# Patient Record
Sex: Female | Born: 1990 | Race: Black or African American | Hispanic: No | Marital: Single | Smoking: Never smoker
Health system: Southern US, Community
[De-identification: ages and names within clinical notes are randomized; demographics above are authoritative.]

## PROBLEM LIST (undated history)

## (undated) ENCOUNTER — Inpatient Hospital Stay (HOSPITAL_COMMUNITY): Payer: Self-pay

## (undated) DIAGNOSIS — Z789 Other specified health status: Secondary | ICD-10-CM

## (undated) DIAGNOSIS — D62 Acute posthemorrhagic anemia: Secondary | ICD-10-CM

## (undated) DIAGNOSIS — Z8759 Personal history of other complications of pregnancy, childbirth and the puerperium: Secondary | ICD-10-CM

## (undated) HISTORY — PX: NO PAST SURGERIES: SHX2092

---

## 2015-07-11 ENCOUNTER — Encounter (HOSPITAL_BASED_OUTPATIENT_CLINIC_OR_DEPARTMENT_OTHER): Payer: Self-pay | Admitting: Emergency Medicine

## 2015-07-11 DIAGNOSIS — L03011 Cellulitis of right finger: Secondary | ICD-10-CM | POA: Insufficient documentation

## 2015-07-11 NOTE — ED Notes (Signed)
Patient states that she has had pain and swelling to her right index finger since wed.

## 2015-07-12 ENCOUNTER — Emergency Department (HOSPITAL_BASED_OUTPATIENT_CLINIC_OR_DEPARTMENT_OTHER)
Admission: EM | Admit: 2015-07-12 | Discharge: 2015-07-12 | Disposition: A | Discharge status: Home / Self Care | Payer: Self-pay | Attending: Emergency Medicine | Admitting: Emergency Medicine

## 2015-07-12 DIAGNOSIS — L03011 Cellulitis of right finger: Secondary | ICD-10-CM

## 2015-07-12 MED ORDER — LIDOCAINE HCL (PF) 1 % IJ SOLN
5.0000 mL | Freq: Once | INTRAMUSCULAR | Status: AC
  Administered 2015-07-12: 5 mL via INTRADERMAL

## 2015-07-12 MED ORDER — LIDOCAINE HCL (PF) 1 % IJ SOLN
INTRAMUSCULAR | Status: AC
  Administered 2015-07-12: 5 mL via INTRADERMAL
  Filled 2015-07-12: qty 5

## 2015-07-12 NOTE — ED Notes (Signed)
Swelling, redness and pain around nail on first finger rt hand x 3 days

## 2015-07-12 NOTE — Discharge Instructions (Signed)
Paronychia °Paronychia is an infection of the skin that surrounds a nail. It usually affects the skin around a fingernail, but it may also occur near a toenail. It often causes pain and swelling around the nail. This condition may come on suddenly or develop over a longer period. In some cases, a collection of pus (abscess) can form near or under the nail. Usually, paronychia is not serious and it clears up with treatment. °CAUSES °This condition may be caused by bacteria or fungi. It is commonly caused by either Streptococcus or Staphylococcus bacteria. The bacteria or fungi often cause the infection by getting into the affected area through an opening in the skin, such as a cut or a hangnail. °RISK FACTORS °This condition is more likely to develop in: °· People who get their hands wet often, such as those who work as dishwashers, bartenders, or nurses. °· People who bite their fingernails or suck their thumbs. °· People who trim their nails too short. °· People who have hangnails or injured fingertips. °· People who get manicures. °· People who have diabetes. °SYMPTOMS °Symptoms of this condition include: °· Redness and swelling of the skin near the nail. °· Tenderness around the nail when you touch the area. °· Pus-filled bumps under the cuticle. The cuticle is the skin at the base or sides of the nail. °· Fluid or pus under the nail. °· Throbbing pain in the area. °DIAGNOSIS °This condition is usually diagnosed with a physical exam. In some cases, a sample of pus may be taken from an abscess to be tested in a lab. This can help to determine what type of bacteria or fungi is causing the condition. °TREATMENT °Treatment for this condition depends on the cause and severity of the condition. If the condition is mild, it may clear up on its own in a few days. Your health care provider may recommend soaking the affected area in warm water a few times a day. When treatment is needed, the options may  include: °· Antibiotic medicine, if the condition is caused by a bacterial infection. °· Antifungal medicine, if the condition is caused by a fungal infection. °· Incision and drainage, if an abscess is present. In this procedure, the health care provider will cut open the abscess so the pus can drain out. °HOME CARE INSTRUCTIONS °· Soak the affected area in warm water if directed to do so by your health care provider. You may be told to do this for 20 minutes, 2-3 times a day. Keep the area dry in between soakings. °· Take medicines only as directed by your health care provider. °· If you were prescribed an antibiotic medicine, finish all of it even if you start to feel better. °· Keep the affected area clean. °· Do not try to drain a fluid-filled bump yourself. °· If you will be washing dishes or performing other tasks that require your hands to get wet, wear rubber gloves. You should also wear gloves if your hands might come in contact with irritating substances, such as cleaners or chemicals. °· Follow your health care provider's instructions about: °¨ Wound care. °¨ Bandage (dressing) changes and removal. °SEEK MEDICAL CARE IF: °· Your symptoms get worse or do not improve with treatment. °· You have a fever or chills. °· You have redness spreading from the affected area. °· You have continued or increased fluid, blood, or pus coming from the affected area. °· Your finger or knuckle becomes swollen or is difficult to move. °  °  This information is not intended to replace advice given to you by your health care provider. Make sure you discuss any questions you have with your health care provider. °  °Document Released: 09/01/2000 Document Revised: 07/23/2014 Document Reviewed: 02/13/2014 °Elsevier Interactive Patient Education ©2016 [employer] ° °

## 2015-07-12 NOTE — ED Provider Notes (Signed)
CSN: [id number]     Arrival date & time 07/11/15  2308 History   First MD Initiated Contact with Patient 07/12/15 0155     Chief Complaint  Patient presents with  . Nail Problem     (Consider location/radiation/quality/duration/timing/severity/associated sxs/prior Treatment) HPI Comments: 25 year old female who presents with right index finger pain. 3 days ago, the patient began having swelling, redness, and pain around her nail on her right second finger. She denies any trauma or biting cuticles. No fevers or other complaints. Normal sensation R hand.  The history is provided by the patient.    History reviewed. No pertinent past medical history. History reviewed. No pertinent past surgical history. History reviewed. No pertinent family history. Social History  Substance Use Topics  . Smoking status: Never Smoker   . Smokeless tobacco: None  . Alcohol Use: Yes     Comment: occ   OB History    No data available     Review of Systems  Constitutional: Negative for fever and chills.  Skin: Positive for color change and wound.  Neurological: Negative for numbness.     Allergies  Review of patient's allergies indicates no known allergies.  Home Medications   Prior to Admission medications   Not on File   BP 110/62 mmHg  Pulse 74  Temp(Src) 97.8 F (36.6 C) (Oral)  Resp 18  Ht 5\' 1"  (1.549 m)  Wt 150 lb (68.04 kg)  BMI 28.36 kg/m2  SpO2 100%  LMP 07/09/2015 Physical Exam  Constitutional: She is oriented to person, place, and time. She appears well-developed and well-nourished. No distress.  HENT:  Head: Normocephalic and atraumatic.  Nose: Nose normal.  Eyes: Conjunctivae are normal.  Musculoskeletal: Normal range of motion. She exhibits tenderness.  TTP distal R 2nd finger around nail bed w/ local erythema and swelling  Neurological: She is alert and oriented to person, place, and time.  Skin: Skin is warm and dry. There is erythema.  Erythema and swelling  at ulnar side of R 2nd fingernail  Psychiatric: She has a normal mood and affect. Judgment normal.  Nursing note and vitals reviewed.   ED Course  .Marland Kitchen.Incision and Drainage Date/Time: 07/12/2015 9:23 AM Performed by: Laurence SpatesLITTLE, Vishaal Strollo MORGAN Authorized by: Laurence SpatesLITTLE, Genavie Boettger MORGAN Consent: Verbal consent obtained. Consent given by: patient Patient identity confirmed: verbally with patient Type: abscess (paronychia) Body area: upper extremity Location details: right index finger Anesthesia: local infiltration Local anesthetic: lidocaine 1% without epinephrine Anesthetic total: 0.2 ml Incision type: single straight Incision depth: dermal Complexity: simple Drainage: purulent Drainage amount: scant Wound treatment: wound left open Packing material: none Patient tolerance: Patient tolerated the procedure well with no immediate complications   (including critical care time) Labs Review Labs Reviewed - No data to display   MDM   Final diagnoses:  Paronychia of finger, right    Pt w/ Paronychia of right index finger. Incised and drained at bedside under local anesthesia; see procedure note for details. Pt tolerated well. Applied bacitracin and discussed supportive care as well as return precautions including any signs of worsening infection. Patient voiced understanding was discharged in satisfactory condition.  Laurence Spatesachel Morgan Areli Jowett, MD 07/12/15 [phone]

## 2015-09-15 LAB — OB RESULTS CONSOLE HEPATITIS B SURFACE ANTIGEN: HEP B S AG: NEGATIVE

## 2015-09-15 LAB — OB RESULTS CONSOLE HIV ANTIBODY (ROUTINE TESTING): HIV: NONREACTIVE

## 2015-09-15 LAB — OB RESULTS CONSOLE RUBELLA ANTIBODY, IGM: RUBELLA: IMMUNE

## 2015-09-15 LAB — OB RESULTS CONSOLE RPR: RPR: NONREACTIVE

## 2015-09-24 LAB — OB RESULTS CONSOLE GC/CHLAMYDIA
Chlamydia: NEGATIVE
GC PROBE AMP, GENITAL: NEGATIVE

## 2015-10-04 ENCOUNTER — Encounter (HOSPITAL_COMMUNITY): Payer: Self-pay | Admitting: *Deleted

## 2015-10-04 ENCOUNTER — Inpatient Hospital Stay (HOSPITAL_COMMUNITY): Payer: PRIVATE HEALTH INSURANCE

## 2015-10-04 ENCOUNTER — Inpatient Hospital Stay (HOSPITAL_COMMUNITY)
Admission: AD | Admit: 2015-10-04 | Discharge: 2015-10-04 | Disposition: A | Discharge status: Home / Self Care | Payer: PRIVATE HEALTH INSURANCE | Source: Ambulatory Visit | Attending: Obstetrics and Gynecology | Admitting: Obstetrics and Gynecology

## 2015-10-04 DIAGNOSIS — O4690 Antepartum hemorrhage, unspecified, unspecified trimester: Secondary | ICD-10-CM

## 2015-10-04 DIAGNOSIS — O209 Hemorrhage in early pregnancy, unspecified: Secondary | ICD-10-CM | POA: Insufficient documentation

## 2015-10-04 DIAGNOSIS — Z6791 Unspecified blood type, Rh negative: Secondary | ICD-10-CM | POA: Insufficient documentation

## 2015-10-04 DIAGNOSIS — Z3A13 13 weeks gestation of pregnancy: Secondary | ICD-10-CM | POA: Insufficient documentation

## 2015-10-04 DIAGNOSIS — N930 Postcoital and contact bleeding: Secondary | ICD-10-CM | POA: Diagnosis not present

## 2015-10-04 DIAGNOSIS — N939 Abnormal uterine and vaginal bleeding, unspecified: Secondary | ICD-10-CM

## 2015-10-04 HISTORY — DX: Other specified health status: Z78.9

## 2015-10-04 LAB — URINE MICROSCOPIC-ADD ON: WBC UA: NONE SEEN WBC/hpf (ref 0–5)

## 2015-10-04 LAB — URINALYSIS, ROUTINE W REFLEX MICROSCOPIC
BILIRUBIN URINE: NEGATIVE
Glucose, UA: NEGATIVE mg/dL
KETONES UR: NEGATIVE mg/dL
LEUKOCYTES UA: NEGATIVE
NITRITE: NEGATIVE
Protein, ur: NEGATIVE mg/dL
Specific Gravity, Urine: <1.005 — ABNORMAL LOW (ref 1.005–1.030)
pH: 6.5 (ref 5.0–8.0)

## 2015-10-04 LAB — ABO/RH: ABO/RH(D): AB NEG

## 2015-10-04 MED ORDER — RHO D IMMUNE GLOBULIN 1500 UNIT/2ML IJ SOSY
300.0000 ug | PREFILLED_SYRINGE | Freq: Once | INTRAMUSCULAR | Status: AC
  Administered 2015-10-04: 300 ug via INTRAMUSCULAR
  Filled 2015-10-04: qty 2

## 2015-10-04 NOTE — MAU Provider Note (Signed)
History     Chief Complaint  Patient presents with  . Vaginal Bleeding   25 yo G1P0 SBF @  13 1/[redacted] weeks gestation presents with c/o postcoital vaginal bleeding since midnight. Pt denies cramping. Bleeding is now spotting but still bright red. [facility]  OB History    Gravida Para Term Preterm AB TAB SAB Ectopic Multiple Living   1               Past Medical History  Diagnosis Date  . Medical history non-contributory     Past Surgical History  Procedure Laterality Date  . No past surgeries      No family history on file.  Social History  Substance Use Topics  . Smoking status: Never Smoker   . Smokeless tobacco: None  . Alcohol Use: Yes     Comment: occ    Allergies: No Known Allergies  Prescriptions prior to admission  Medication Sig Dispense Refill Last Dose  . Prenatal Vit-Fe Fumarate-FA (PRENATAL MULTIVITAMIN) TABS tablet Take 1 tablet by mouth daily.   10/03/2015 at Unknown time     Physical Exam   Blood pressure 133/79, pulse 97, temperature 97.9 F (36.6 C), temperature source Oral, resp. rate 16, last menstrual period 07/09/2015.  General appearance: alert, cooperative and no distress Lungs: clear to auscultation bilaterally Heart: regular rate and rhythm, S1, S2 normal, no murmur, click, rub or gallop Abdomen: soft, non-tender; bowel sounds normal; no masses,  no organomegaly Pelvic: adnexae not palpable, cervix normal in appearance, external genitalia normal and vagina (+) scant BRB. cervix closed/firm, uterus gravid nontender Extremities: no edema, redness or tenderness in the calves or thighs Skin: Skin color, texture, turgor normal. No rashes or lesions   ED Course   vaginal bleeding in pregnancy<22 week Rh negative IUP @ 13 1/7 weeks P) sonogram . Rhig w/u. Rhophylac today.  If sonogram nl, d/c home Abstain from intercourse until stop bleeding MDM   Kenneisha Cochrane A, MD 4:30 PM  10/04/2015    Addendum:  [place] Ob Comp Less 14 Wks  10/04/2015  CLINICAL DATA:  Vaginal bleeding since midnight. Gravida 1 para 0. Uncertain LMP. EXAM: OBSTETRIC <14 WK ULTRASOUND TECHNIQUE: Transabdominal ultrasound was performed for evaluation of the gestation as well as the maternal uterus and adnexal regions. COMPARISON:  None. FINDINGS: Intrauterine gestational sac: Present Yolk sac:  Not seen Embryo:  Present Cardiac Activity: Present Heart Rate: 159 bpm CRL:   67  mm   13 w 0 d                  US EDC: 04/10/2016 Subchorionic hemorrhage:  None visualized. Maternal uterus/adnexae: Right ovary is not visualized. Left ovary has a normal appearance. No free pelvic fluid. IMPRESSION: 1. Single living intrauterine embryo. 2. Size by ultrasound correlates with EDC of 04/10/2016. Electronically Signed   By: Norva PavlovElizabeth  Brown M.D.   On: 10/04/2015 17:08

## 2015-10-04 NOTE — MAU Note (Signed)
Vaginal bleeding since last night around 0000.  As much as a flow of a period and it has tapered off since then, almost just spotting.  Denies pain.

## 2015-10-04 NOTE — MAU Note (Signed)
Urine sent to lab 

## 2015-10-04 NOTE — Progress Notes (Signed)
Results reviewed with Dr cousins.  D/c patient to home. Keep scheduled appointment, no intercourse, call office with any other concerns

## 2015-10-05 LAB — RH IG WORKUP (INCLUDES ABO/RH)
ABO/RH(D): AB NEG
Antibody Screen: NEGATIVE
GESTATIONAL AGE(WKS): 13
UNIT DIVISION: 0

## 2016-01-17 ENCOUNTER — Emergency Department (HOSPITAL_BASED_OUTPATIENT_CLINIC_OR_DEPARTMENT_OTHER)
Admission: EM | Admit: 2016-01-17 | Discharge: 2016-01-17 | Disposition: A | Discharge status: Home / Self Care | Payer: BLUE CROSS/BLUE SHIELD | Attending: Emergency Medicine | Admitting: Emergency Medicine

## 2016-01-17 ENCOUNTER — Encounter (HOSPITAL_BASED_OUTPATIENT_CLINIC_OR_DEPARTMENT_OTHER): Payer: Self-pay | Admitting: *Deleted

## 2016-01-17 DIAGNOSIS — R1084 Generalized abdominal pain: Secondary | ICD-10-CM | POA: Insufficient documentation

## 2016-01-17 DIAGNOSIS — O26893 Other specified pregnancy related conditions, third trimester: Secondary | ICD-10-CM | POA: Insufficient documentation

## 2016-01-17 DIAGNOSIS — Z3A28 28 weeks gestation of pregnancy: Secondary | ICD-10-CM | POA: Diagnosis not present

## 2016-01-17 DIAGNOSIS — R109 Unspecified abdominal pain: Secondary | ICD-10-CM

## 2016-01-17 DIAGNOSIS — Z79899 Other long term (current) drug therapy: Secondary | ICD-10-CM | POA: Diagnosis not present

## 2016-01-17 LAB — URINALYSIS, ROUTINE W REFLEX MICROSCOPIC
BILIRUBIN URINE: NEGATIVE
Glucose, UA: NEGATIVE mg/dL
HGB URINE DIPSTICK: NEGATIVE
Ketones, ur: NEGATIVE mg/dL
Nitrite: NEGATIVE
PH: 7 (ref 5.0–8.0)
Protein, ur: NEGATIVE mg/dL
SPECIFIC GRAVITY, URINE: 1.012 (ref 1.005–1.030)

## 2016-01-17 LAB — URINE MICROSCOPIC-ADD ON: RBC / HPF: NONE SEEN RBC/hpf (ref 0–5)

## 2016-01-17 MED ORDER — SODIUM CHLORIDE 0.9 % IV BOLUS (SEPSIS)
1000.0000 mL | Freq: Once | INTRAVENOUS | Status: AC
Start: 2016-01-17 — End: 2016-01-17
  Administered 2016-01-17: 1000 mL via INTRAVENOUS

## 2016-01-17 NOTE — Progress Notes (Signed)
Spoke with Dr. Cherly Hensenousins. Pt is OB cleared. Okay to d/c fetal monitor. Ed staff will either send urine for culture or will repeat U/A.

## 2016-01-17 NOTE — ED Triage Notes (Addendum)
Pt reports cramping abd pain that began last night; states this is her first pregnancy; EDD 04/09/2016; denies fever, n/v/d. Pt placed on toco monitor at this time. Reports pregnancy has been uncomplicated thus far. Reports good fetal movement.

## 2016-01-17 NOTE — ED Notes (Signed)
Pt taken off monitor to use the bathroom.

## 2016-01-17 NOTE — ED Notes (Signed)
MD at bedside. 

## 2016-01-17 NOTE — ED Triage Notes (Signed)
Mary, rapid response OB nurse called.

## 2016-01-17 NOTE — Progress Notes (Signed)
Spoke with Dr. Cherly Hensenousins. Pt is a G1P0 at [redacted] weeks gestation with c/o abd cramping. No vaginal bleeding or leaking of fluid. No uc's noted at this time. FHR 145, mod variability, no accels, no decels. Says she wants pt to have a U/A and they are to treat her if necessary . She can be dc'd home after that.

## 2016-01-17 NOTE — Progress Notes (Addendum)
Spoke with Claria Dicehristy Golden RN. Pt is to have a U/A per Dr. Cherly Hensenousins, they can treat her if necessary. Pt can be dc'd home. Dr. Cherly Hensenousins phone number given to [employer], [phone].

## 2016-01-17 NOTE — ED Notes (Signed)
Bedside ultrasound being performed by MD at this time

## 2016-01-17 NOTE — Progress Notes (Signed)
[facility], Claria Dicehristy Golden RN. Pt is a G1P0 at [redacted] weeks gestation with c/o abd cramping that started last night. No vaginal bleeding or leaking of fluid. Says Dr. Cherly Hensenousins is her OB/GYN.

## 2016-01-17 NOTE — ED Notes (Signed)
Spoke with Corrie DandyMary, rapid response OB nurse, about pt's plan of care and discharge instructions.

## 2016-01-17 NOTE — Progress Notes (Signed)
Spoke with [employer] Data Systems[employer]y RN. Okay to d/c fetal monitor. Recheck pt's urine or send it for culture. TX pt as necessary and then d/c her home.

## 2016-01-17 NOTE — Progress Notes (Signed)
Spoke with ChristyRN. Pt has been off the fetal monitor for about . Says she will adjust her monitor.

## 2016-01-17 NOTE — ED Provider Notes (Signed)
[facility]   CSN: [id number] Arrival date & time: 01/17/16  [id number]     History   Chief Complaint Chief Complaint  Patient presents with  . Abdominal Pain    HPI Alison Schultz is a 25 y.o. female.  [name] gravida 1 para 0 with a chief complaints of abdominal cramping. Going on since last night. Patient is approximately [redacted] weeks pregnant. Has had no complications with this pregnancy. Denies hypertension denies diabetes. Denies any vaginal bleeding or vaginal discharge. Denies fevers or chills. Patient's cramping has continued and so she sought medical treatment.   The history is provided by the patient and a parent.  Abdominal Pain   This is a new problem. The current episode started yesterday. The problem occurs constantly. The problem has not changed since onset.The pain is associated with an unknown factor. The pain is located in the generalized abdominal region. The quality of the pain is cramping. The pain is at a severity of 7/10. The pain is moderate. Pertinent negatives include fever, nausea, vomiting, dysuria, headaches, arthralgias and myalgias. Nothing aggravates the symptoms. Nothing relieves the symptoms.    Past Medical History:  Diagnosis Date  . Medical history non-contributory     There are no active problems to display for this patient.   Past Surgical History:  Procedure Laterality Date  . NO PAST SURGERIES      OB History    Gravida Para Term Preterm AB Living   1             SAB TAB Ectopic Multiple Live Births                   Home Medications    Prior to Admission medications   Medication Sig Start Date End Date Taking? Authorizing Provider  Prenatal Vit-Fe Fumarate-FA (PRENATAL MULTIVITAMIN) TABS tablet Take 1 tablet by mouth daily.    Historical Provider, MD    Family History No family history on file.  Social History Social History  Substance Use Topics  . Smoking status: Never Smoker  . Smokeless  tobacco: Never Used  . Alcohol use No     Allergies   Review of patient's allergies indicates no known allergies.   Review of Systems Review of Systems  Constitutional: Negative for chills and fever.  HENT: Negative for congestion and rhinorrhea.   Eyes: Negative for redness and visual disturbance.  Respiratory: Negative for shortness of breath and wheezing.   Cardiovascular: Negative for chest pain and palpitations.  Gastrointestinal: Positive for abdominal pain. Negative for nausea and vomiting.  Genitourinary: Positive for pelvic pain. Negative for dysuria and urgency.  Musculoskeletal: Negative for arthralgias and myalgias.  Skin: Negative for pallor and wound.  Neurological: Negative for dizziness and headaches.     Physical Exam Updated Vital Signs BP 127/83 (BP Location: Left Arm)   Pulse 96   Resp 22   Ht 5\' 1"  (1.549 m)   LMP 07/09/2015   SpO2 100%   Physical Exam  Constitutional: She is oriented to person, place, and time. She appears well-developed and well-nourished. No distress.  HENT:  Head: Normocephalic and atraumatic.  Eyes: EOM are normal. Pupils are equal, round, and reactive to light.  Neck: Normal range of motion. Neck supple.  Cardiovascular: Normal rate and regular rhythm.  Exam reveals no gallop and no friction rub.   No murmur heard. Pulmonary/Chest: Effort normal. She has no wheezes. She has no rales.  Abdominal: Soft. She  exhibits no distension and no mass. There is no tenderness. There is no guarding.  Palpable fundal height 4 finger breaths above the umbilicus.  Musculoskeletal: She exhibits no edema or tenderness.  Neurological: She is alert and oriented to person, place, and time.  Skin: Skin is warm and dry. She is not diaphoretic.  Psychiatric: She has a normal mood and affect. Her behavior is normal.  Nursing note and vitals reviewed.    ED Treatments / Results  Labs (all labs ordered are listed, but only abnormal results are  displayed) Labs Reviewed  URINALYSIS, ROUTINE W REFLEX MICROSCOPIC (NOT [facility]) - Abnormal; Notable for the following:       Result Value   APPearance CLOUDY (*)    Leukocytes, UA SMALL (*)    All other components within normal limits  URINE MICROSCOPIC-ADD ON - Abnormal; Notable for the following:    Squamous Epithelial / LPF 6-30 (*)    Bacteria, UA MANY (*)    All other components within normal limits  URINE CULTURE    EKG  EKG Interpretation None       Radiology No results found.  Procedures Procedures (including critical care time)  EMERGENCY DEPARTMENT US PREGNANCY "Study: Limited Ultrasound of the Pelvis"  INDICATIONS:Pregnancy(required) and Pelvic pain Multiple views of the uterus and pelvic cavity are obtained with a multi-frequency probe.  APPROACH:Transabdominal   PERFORMED BY: Myself  IMAGES ARCHIVED?: Yes  LIMITATIONS: Body habitus  PREGNANCY FREE FLUID: None  PREGNANCY UTERUS FINDINGS:Uterus enlarged ADNEXAL FINDINGS:Left ovary not seen and Right ovary not seen  PREGNANCY FINDINGS: Intrauterine gestational sac noted and Fetal heart activity seen  INTERPRETATION: Viable intrauterine pregnancy and Pelvic free fluid absent  GESTATIONAL AGE, ESTIMATE: 25 wk 6 days HR 150   Medications Ordered in ED Medications  sodium chloride 0.9 % bolus 1,000 mL (0 mLs Intravenous Stopped 01/17/16 1032)     Initial Impression / Assessment and Plan / ED Course  I have reviewed the triage vital signs and the nursing notes.  Pertinent labs & imaging results that were available during my care of the patient were reviewed by me and considered in my medical decision making (see chart for details).  Clinical Course    [name] With a chief complaint of abdominal pain. Going on since yesterday. Rapid response OB was contacted. Will obtain a UA we will watch her on the monitor. Bedside ultrasound reassuring.  Cleared by OB.  UA likely contaminated, will send  culture.    12:23 PM:  I have discussed the diagnosis/risks/treatment options with the patient and family and believe the pt to be eligible for discharge home to follow-up with OB. We also discussed returning to the ED immediately if new or worsening sx occur. We discussed the sx which are most concerning (e.g., sudden worsening pain, fever, inability to tolerate by mouth) that necessitate immediate return. Medications administered to the patient during their visit and any new prescriptions provided to the patient are listed below.  Medications given during this visit Medications  sodium chloride 0.9 % bolus 1,000 mL (0 mLs Intravenous Stopped 01/17/16 1032)     The patient appears reasonably screen and/or stabilized for discharge and I doubt any other medical condition or other [facility] requiring further screening, evaluation, or treatment in the ED at this time prior to discharge.    Final Clinical Impressions(s) / ED Diagnoses   Final diagnoses:  Abdominal cramping    New Prescriptions New Prescriptions   No medications  on file     Melene Plan, DO 01/17/16 1223

## 2016-01-17 NOTE — Discharge Instructions (Signed)
Follow up with your OB.

## 2016-01-17 NOTE — ED Notes (Signed)
Pt placed on fetal monitor, cardiac monitor, pulse ox and continuous VS. Mother at bedside.

## 2016-01-17 NOTE — ED Notes (Signed)
Toco monitor DC'd per Corrie DandyMary, rapid response OB nurse.

## 2016-01-18 ENCOUNTER — Telehealth (HOSPITAL_BASED_OUTPATIENT_CLINIC_OR_DEPARTMENT_OTHER): Payer: Self-pay | Admitting: Emergency Medicine

## 2016-01-18 LAB — URINE CULTURE

## 2016-01-18 NOTE — Telephone Encounter (Signed)
Post ED Visit - Positive Culture Follow-up: Successful Patient Follow-Up  Culture assessed and recommendations reviewed by: []  Enzo BiNathan Batchelder, Pharm.D. []  Celedonio MiyamotoJeremy Frens, Pharm.D., BCPS []  Garvin FilaMike Maccia, Pharm.D. []  Georgina PillionElizabeth Martin, Pharm.D., BCPS []  McAllisterMinh Pham, [address]Pharm.D., BCPS, AAHIVP []  Estella HuskMichelle Turner, Pharm.D., BCPS, AAHIVP []  Cassie Stewart, Pharm.D. []  Sherle Poeob Vincent, [address]  Positive urine culture  [x]  Patient discharged without antimicrobial prescription and treatment is now indicated []  Organism is resistant to prescribed ED discharge antimicrobial []  Patient with positive blood cultures  Changes discussed with ED provider: Dr. Melene Planan Floyd New antibiotic prescription Keflex 500mg  po bid x 7 days [facility] [place]  Contacted patient, 01/18/16 1458   Berle MullMiller, Yafet Cline 01/18/2016, 2:58 PM

## 2016-02-25 ENCOUNTER — Encounter (HOSPITAL_COMMUNITY): Payer: Self-pay | Admitting: *Deleted

## 2016-02-25 ENCOUNTER — Inpatient Hospital Stay (HOSPITAL_COMMUNITY)
Admission: AD | Admit: 2016-02-25 | Discharge: 2016-02-25 | Disposition: A | Discharge status: Home / Self Care | Payer: BLUE CROSS/BLUE SHIELD | Source: Ambulatory Visit | Attending: Obstetrics & Gynecology | Admitting: Obstetrics & Gynecology

## 2016-02-25 DIAGNOSIS — O9989 Other specified diseases and conditions complicating pregnancy, childbirth and the puerperium: Secondary | ICD-10-CM

## 2016-02-25 DIAGNOSIS — R102 Pelvic and perineal pain: Secondary | ICD-10-CM | POA: Insufficient documentation

## 2016-02-25 DIAGNOSIS — Z3A33 33 weeks gestation of pregnancy: Secondary | ICD-10-CM | POA: Diagnosis not present

## 2016-02-25 DIAGNOSIS — O26893 Other specified pregnancy related conditions, third trimester: Secondary | ICD-10-CM | POA: Diagnosis not present

## 2016-02-25 LAB — URINALYSIS, ROUTINE W REFLEX MICROSCOPIC
Bilirubin Urine: NEGATIVE
Glucose, UA: NEGATIVE mg/dL
Hgb urine dipstick: NEGATIVE
KETONES UR: NEGATIVE mg/dL
Nitrite: NEGATIVE
PH: 6 (ref 5.0–8.0)
Protein, ur: NEGATIVE mg/dL
SPECIFIC GRAVITY, URINE: 1.011 (ref 1.005–1.030)

## 2016-02-25 MED ORDER — ACETAMINOPHEN 500 MG PO TABS
1000.0000 mg | ORAL_TABLET | Freq: Once | ORAL | Status: AC
  Administered 2016-02-25: 1000 mg via ORAL
  Filled 2016-02-25: qty 2

## 2016-02-25 NOTE — MAU Note (Signed)
C/o intermittent, sharp vaginal pressure since about 1530 today; the pain started at work but pt states that she has not lifted anything or had any injury;

## 2016-02-25 NOTE — MAU Provider Note (Signed)
History     CSN: [id number]  Arrival date and time: 02/25/16 1718   First Provider Initiated Contact with Patient 02/25/16 1810      Chief Complaint  Patient presents with  . Vaginal Pain   HPI   [name] is a 25 y.o. female G1P0 @ 6053w5d here in MAU with vaginal pressure. The pain started today @ 1500 while she was at work. The patient works at [employer]; she is on her feet a lot at work. The pain comes and goes, standing does not make the pain worse. Nothing makes the pain better.   + fetal movement Denies vaginal bleeding or leaking of water.  No intercourse in the last 24 hours.   OB History    Gravida Para Term Preterm AB Living   1             SAB TAB Ectopic Multiple Live Births                  Past Medical History:  Diagnosis Date  . Medical history non-contributory     Past Surgical History:  Procedure Laterality Date  . NO PAST SURGERIES      No family history on file.  Social History  Substance Use Topics  . Smoking status: Never Smoker  . Smokeless tobacco: Never Used  . Alcohol use No    Allergies: No Known Allergies  No prescriptions prior to admission.   Results for orders placed or performed during the hospital encounter of 02/25/16 (from the past 48 hour(s))  Urinalysis, Routine w reflex microscopic     Status: Abnormal   Collection Time: 02/25/16  5:30 PM  Result Value Ref Range   Color, Urine YELLOW YELLOW   APPearance HAZY (A) CLEAR   Specific Gravity, Urine 1.011 1.005 - 1.030   pH 6.0 5.0 - 8.0   Glucose, UA NEGATIVE NEGATIVE mg/dL   Hgb urine dipstick NEGATIVE NEGATIVE   Bilirubin Urine NEGATIVE NEGATIVE   Ketones, ur NEGATIVE NEGATIVE mg/dL   Protein, ur NEGATIVE NEGATIVE mg/dL   Nitrite NEGATIVE NEGATIVE   Leukocytes, UA SMALL (A) NEGATIVE   RBC / HPF 0-5 0 - 5 RBC/hpf   WBC, UA 0-5 0 - 5 WBC/hpf   Bacteria, UA RARE (A) NONE SEEN   Squamous Epithelial / LPF 6-30 (A) NONE SEEN   Mucous PRESENT     Review of  Systems  Constitutional: Negative for chills and fever.  Gastrointestinal: Negative for abdominal pain and constipation.  Genitourinary: Negative for dysuria and urgency.   Physical Exam   Blood pressure 130/78, pulse 89, temperature 98 F (36.7 C), resp. rate 20, last menstrual period 07/09/2015.  Physical Exam  Constitutional: She is oriented to person, place, and time. She appears well-developed and well-nourished. No distress.  HENT:  Head: Normocephalic.  Eyes: Pupils are equal, round, and reactive to light.  Respiratory: Effort normal.  GI: Soft.  Genitourinary:  Genitourinary Comments: Cervix closed, thick, posterior   Musculoskeletal: Normal range of motion.  Neurological: She is alert and oriented to person, place, and time.  Skin: Skin is warm. She is not diaphoretic.  Psychiatric: Her behavior is normal.   Fetal Tracing: Baseline: 125 bpm  Variability: Moderate  Accelerations: 15x15 Decelerations: None Toco: None  MAU Course  Procedures  None  MDM  UA Urine culture pending  Cervix rechecked prior to DC Tylenol 1 gram PO  Discussed patient with Dr. Juliene PinaMody, Dr. Cherly Hensenousins out of town and unavailable at this  time.   Assessment and Plan   A:  1. Vaginal pain     P:  Discharge home in stable condition Strict return precautions Follow up with Dr. [name] OkKatherine Schultz to take tylenol over the counter as directed on the bottle   Duane LopeJennifer I Alex Leahy, NP 02/25/2016 7:53 PM

## 2016-02-25 NOTE — Discharge Instructions (Signed)
Third Trimester of Pregnancy The third trimester is from week 29 through week 40 (months 7 through 9). The third trimester is a time when the unborn baby (fetus) is growing rapidly. At the end of the ninth month, the fetus is about 20 inches in length and weighs 6-10 pounds. Body changes during your third trimester Your body goes through many changes during pregnancy. The changes vary from woman to woman. During the third trimester:  Your weight will continue to increase. You can expect to gain 25-35 pounds (11-16 kg) by the end of the pregnancy.  You may begin to get stretch marks on your hips, abdomen, and breasts.  You may urinate more often because the fetus is moving lower into your pelvis and pressing on your bladder.  You may develop or continue to have heartburn. This is caused by increased hormones that slow down muscles in the digestive tract.  You may develop or continue to have constipation because increased hormones slow digestion and cause the muscles that push waste through your intestines to relax.  You may develop hemorrhoids. These are swollen veins (varicose veins) in the rectum that can itch or be painful.  You may develop swollen, bulging veins (varicose veins) in your legs.  You may have increased body aches in the pelvis, back, or thighs. This is due to weight gain and increased hormones that are relaxing your joints.  You may have changes in your hair. These can include thickening of your hair, rapid growth, and changes in texture. Some women also have hair loss during or after pregnancy, or hair that feels dry or thin. Your hair will most likely return to normal after your baby is born.  Your breasts will continue to grow and they will continue to become tender. A yellow fluid (colostrum) may leak from your breasts. This is the first milk you are producing for your baby.  Your belly button may stick out.  You may notice more swelling in your hands, face, or  ankles.  You may have increased tingling or numbness in your hands, arms, and legs. The skin on your belly may also feel numb.  You may feel short of breath because of your expanding uterus.  You may have more problems sleeping. This can be caused by the size of your belly, increased need to urinate, and an increase in your body's metabolism.  You may notice the fetus "dropping," or moving lower in your abdomen.  You may have increased vaginal discharge.  Your cervix becomes thin and soft (effaced) near your due date. What to expect at prenatal visits You will have prenatal exams every 2 weeks until week 36. Then you will have weekly prenatal exams. During a routine prenatal visit:  You will be weighed to make sure you and the fetus are growing normally.  Your blood pressure will be taken.  Your abdomen will be measured to track your baby's growth.  The fetal heartbeat will be listened to.  Any test results from the previous visit will be discussed.  You may have a cervical check near your due date to see if you have effaced. At around 36 weeks, your health care provider will check your cervix. At the same time, your health care provider will also perform a test on the secretions of the vaginal tissue. This test is to determine if a type of bacteria, Group B streptococcus, is present. Your health care provider will explain this further. Your health care provider may ask you:    What your birth plan is.  How you are feeling.  If you are feeling the baby move.  If you have had any abnormal symptoms, such as leaking fluid, bleeding, severe headaches, or abdominal cramping.  If you are using any tobacco products, including cigarettes, chewing tobacco, and electronic cigarettes.  If you have any questions. Other tests or screenings that may be performed during your third trimester include:  Blood tests that check for low iron levels (anemia).  Fetal testing to check the health,  activity level, and growth of the fetus. Testing is done if you have certain medical conditions or if there are problems during the pregnancy.  Nonstress test (NST). This test checks the health of your baby to make sure there are no signs of problems, such as the baby not getting enough oxygen. During this test, a belt is placed around your belly. The baby is made to move, and its heart rate is monitored during movement. What is false labor? False labor is a condition in which you feel small, irregular tightenings of the muscles in the womb (contractions) that eventually go away. These are called Braxton Hicks contractions. Contractions may last for hours, days, or even weeks before true labor sets in. If contractions come at regular intervals, become more frequent, increase in intensity, or become painful, you should see your health care provider. What are the signs of labor?  Abdominal cramps.  Regular contractions that start at 10 minutes apart and become stronger and more frequent with time.  Contractions that start on the top of the uterus and spread down to the lower abdomen and back.  Increased pelvic pressure and dull back pain.  A watery or bloody mucus discharge that comes from the vagina.  Leaking of amniotic fluid. This is also known as your "water breaking." It could be a slow trickle or a gush. Let your doctor know if it has a color or strange odor. If you have any of these signs, call your health care provider right away, even if it is before your due date. Follow these instructions at home: Eating and drinking  Continue to eat regular, healthy meals.  Do not eat:  Raw meat or meat spreads.  Unpasteurized milk or cheese.  Unpasteurized juice.  Store-made salad.  Refrigerated smoked seafood.  Hot dogs or deli meat, unless they are piping hot.  More than 6 ounces of albacore tuna a week.  Shark, swordfish, king mackerel, or tile fish.  Store-made salads.  Raw  sprouts, such as mung bean or alfalfa sprouts.  Take prenatal vitamins as told by your health care provider.  Take 1000 mg of calcium daily as told by your health care provider.  If you develop constipation:  Take over-the-counter or prescription medicines.  Drink enough fluid to keep your urine clear or pale yellow.  Eat foods that are high in fiber, such as fresh fruits and vegetables, whole grains, and beans.  Limit foods that are high in fat and processed sugars, such as fried and sweet foods. Activity  Exercise only as directed by your health care provider. Healthy pregnant women should aim for 2 hours and 30 minutes of moderate exercise per week. If you experience any pain or discomfort while exercising, stop.  Avoid heavy lifting.  Do not exercise in extreme heat or humidity, or at high altitudes.  Wear low-heel, comfortable shoes.  Practice good posture.  Do not travel far distances unless it is absolutely necessary and only with the approval   of your health care provider.  Wear your seat belt at all times while in a car, on a bus, or on a plane.  Take frequent breaks and rest with your legs elevated if you have leg cramps or low back pain.  Do not use hot tubs, steam rooms, or saunas.  You may continue to have sex unless your health care provider tells you otherwise. Lifestyle  Do not use any products that contain nicotine or tobacco, such as cigarettes and e-cigarettes. If you need help quitting, ask your health care provider.  Do not drink alcohol.  Do not use any medicinal herbs or unprescribed drugs. These chemicals affect the formation and growth of the baby.  If you develop varicose veins:  Wear support pantyhose or compression stockings as told by your healthcare provider.  Elevate your feet for 15 minutes, 3-4 times a day.  Wear a supportive maternity bra to help with breast tenderness. General instructions  Take over-the-counter and prescription  medicines only as told by your health care provider. There are medicines that are either safe or unsafe to take during pregnancy.  Take warm sitz baths to soothe any pain or discomfort caused by hemorrhoids. Use hemorrhoid cream or witch hazel if your health care provider approves.  Avoid cat litter boxes and soil used by cats. These carry germs that can cause birth defects in the baby. If you have a cat, ask someone to clean the litter box for you.  To prepare for the arrival of your baby:  Take prenatal classes to understand, practice, and ask questions about the labor and delivery.  Make a trial run to the hospital.  Visit the hospital and tour the maternity area.  Arrange for maternity or paternity leave through employers.  Arrange for family and friends to take care of pets while you are in the hospital.  Purchase a rear-facing car seat and make sure you know how to install it in your car.  Pack your hospital bag.  Prepare the baby's nursery. Make sure to remove all pillows and stuffed animals from the baby's crib to prevent suffocation.  Visit your dentist if you have not gone during your pregnancy. Use a soft toothbrush to brush your teeth and be gentle when you floss.  Keep all prenatal follow-up visits as told by your health care provider. This is important. Contact a health care provider if:  You are unsure if you are in labor or if your water has broken.  You become dizzy.  You have mild pelvic cramps, pelvic pressure, or nagging pain in your abdominal area.  You have lower back pain.  You have persistent nausea, vomiting, or diarrhea.  You have an unusual or bad smelling vaginal discharge.  You have pain when you urinate. Get help right away if:  You have a fever.  You are leaking fluid from your vagina.  You have spotting or bleeding from your vagina.  You have severe abdominal pain or cramping.  You have rapid weight loss or weight gain.  You have  shortness of breath with chest pain.  You notice sudden or extreme swelling of your face, hands, ankles, feet, or legs.  Your baby makes fewer than 10 movements in 2 hours.  You have severe headaches that do not go away with medicine.  You have vision changes. Summary  The third trimester is from week 29 through week 40, months 7 through 9. The third trimester is a time when the unborn baby (fetus)   is growing rapidly.  During the third trimester, your discomfort may increase as you and your baby continue to gain weight. You may have abdominal, leg, and back pain, sleeping problems, and an increased need to urinate.  During the third trimester your breasts will keep growing and they will continue to become tender. A yellow fluid (colostrum) may leak from your breasts. This is the first milk you are producing for your baby.  False labor is a condition in which you feel small, irregular tightenings of the muscles in the womb (contractions) that eventually go away. These are called Braxton Hicks contractions. Contractions may last for hours, days, or even weeks before true labor sets in.  Signs of labor can include: abdominal cramps; regular contractions that start at 10 minutes apart and become stronger and more frequent with time; watery or bloody mucus discharge that comes from the vagina; increased pelvic pressure and dull back pain; and leaking of amniotic fluid. This information is not intended to replace advice given to you by your health care provider. Make sure you discuss any questions you have with your health care provider. Document Released: 03/02/2001 Document Revised: 08/14/2015 Document Reviewed: 05/09/2012 Elsevier Interactive Patient Education  2017 [employer]  

## 2016-02-27 ENCOUNTER — Encounter: Payer: Self-pay | Admitting: Advanced Practice Midwife

## 2016-02-27 DIAGNOSIS — B951 Streptococcus, group B, as the cause of diseases classified elsewhere: Secondary | ICD-10-CM | POA: Insufficient documentation

## 2016-02-27 DIAGNOSIS — O2340 Unspecified infection of urinary tract in pregnancy, unspecified trimester: Secondary | ICD-10-CM

## 2016-02-27 LAB — CULTURE, OB URINE

## 2016-03-17 LAB — OB RESULTS CONSOLE GBS: GBS: POSITIVE

## 2016-03-22 NOTE — L&D Delivery Note (Addendum)
Operative Delivery Note At 10:34 PM a viable and healthy female was delivered via Vaginal, Vacuum Investment banker, operational(Extractor).  Presentation: vertex; Position: Right,, Occiput,, Anterior; Station: +4.  Verbal consent: obtained from patient.  Risks and benefits discussed in detail.  Risks include, but are not limited to the risks of anesthesia, bleeding, infection, damage to maternal tissues, fetal cephalhematoma.  There is also the risk of inability to effect vaginal delivery of the head, or shoulder dystocia that cannot be resolved by established maneuvers, leading to the need for emergency cesarean section.  APGAR: 8, 9; weight  .   Placenta status: spontaneous intact not sent , .   Cord:  CAN x 1 reducible with the following complications: none.  Cord pH: none  Anesthesia:epidural   Instruments: mushroom vacuum due to fetal bradycardia Episiotomy: None Lacerations: None Suture Repair: n/a Est. Blood Loss (mL): 300  Mom to postpartum.  Baby to Couplet care / Skin to Skin.  Venida Tsukamoto A 03/30/2016, 10:49 PM

## 2016-03-30 ENCOUNTER — Inpatient Hospital Stay (HOSPITAL_COMMUNITY)
Admission: AD | Admit: 2016-03-30 | Discharge: 2016-04-01 | DRG: 774 | Disposition: A | Discharge status: Home / Self Care | Payer: BLUE CROSS/BLUE SHIELD | Source: Ambulatory Visit | Attending: Obstetrics and Gynecology | Admitting: Obstetrics and Gynecology

## 2016-03-30 ENCOUNTER — Inpatient Hospital Stay (HOSPITAL_COMMUNITY): Payer: BLUE CROSS/BLUE SHIELD | Admitting: Anesthesiology

## 2016-03-30 ENCOUNTER — Inpatient Hospital Stay (HOSPITAL_COMMUNITY)
Admission: AD | Admit: 2016-03-30 | Discharge: 2016-03-30 | Disposition: A | Discharge status: Home / Self Care | Payer: BLUE CROSS/BLUE SHIELD | Source: Ambulatory Visit | Attending: Obstetrics and Gynecology | Admitting: Obstetrics and Gynecology

## 2016-03-30 ENCOUNTER — Encounter (HOSPITAL_COMMUNITY): Payer: Self-pay

## 2016-03-30 ENCOUNTER — Encounter (HOSPITAL_COMMUNITY): Payer: Self-pay | Admitting: *Deleted

## 2016-03-30 DIAGNOSIS — O9989 Other specified diseases and conditions complicating pregnancy, childbirth and the puerperium: Secondary | ICD-10-CM

## 2016-03-30 DIAGNOSIS — O26899 Other specified pregnancy related conditions, unspecified trimester: Secondary | ICD-10-CM

## 2016-03-30 DIAGNOSIS — B951 Streptococcus, group B, as the cause of diseases classified elsewhere: Secondary | ICD-10-CM | POA: Diagnosis present

## 2016-03-30 DIAGNOSIS — Z8759 Personal history of other complications of pregnancy, childbirth and the puerperium: Secondary | ICD-10-CM

## 2016-03-30 DIAGNOSIS — O99824 Streptococcus B carrier state complicating childbirth: Secondary | ICD-10-CM | POA: Diagnosis present

## 2016-03-30 DIAGNOSIS — Z283 Underimmunization status: Secondary | ICD-10-CM

## 2016-03-30 DIAGNOSIS — Z6791 Unspecified blood type, Rh negative: Secondary | ICD-10-CM | POA: Diagnosis not present

## 2016-03-30 DIAGNOSIS — O26893 Other specified pregnancy related conditions, third trimester: Secondary | ICD-10-CM | POA: Diagnosis present

## 2016-03-30 DIAGNOSIS — O9081 Anemia of the puerperium: Secondary | ICD-10-CM | POA: Diagnosis not present

## 2016-03-30 DIAGNOSIS — D62 Acute posthemorrhagic anemia: Secondary | ICD-10-CM | POA: Diagnosis not present

## 2016-03-30 DIAGNOSIS — N390 Urinary tract infection, site not specified: Secondary | ICD-10-CM | POA: Diagnosis present

## 2016-03-30 DIAGNOSIS — Z2839 Other underimmunization status: Secondary | ICD-10-CM

## 2016-03-30 DIAGNOSIS — Z3493 Encounter for supervision of normal pregnancy, unspecified, third trimester: Secondary | ICD-10-CM | POA: Diagnosis present

## 2016-03-30 DIAGNOSIS — Z3A38 38 weeks gestation of pregnancy: Secondary | ICD-10-CM

## 2016-03-30 DIAGNOSIS — O09899 Supervision of other high risk pregnancies, unspecified trimester: Secondary | ICD-10-CM

## 2016-03-30 DIAGNOSIS — O2340 Unspecified infection of urinary tract in pregnancy, unspecified trimester: Secondary | ICD-10-CM

## 2016-03-30 HISTORY — DX: Personal history of other complications of pregnancy, childbirth and the puerperium: Z87.59

## 2016-03-30 HISTORY — DX: Acute posthemorrhagic anemia: D62

## 2016-03-30 LAB — CBC
HCT: 33.6 % — ABNORMAL LOW (ref 36.0–46.0)
HEMOGLOBIN: 11.2 g/dL — AB (ref 12.0–15.0)
MCH: 27 pg (ref 26.0–34.0)
MCHC: 33.3 g/dL (ref 30.0–36.0)
MCV: 81 fL (ref 78.0–100.0)
Platelets: 249 10*3/uL (ref 150–400)
RBC: 4.15 MIL/uL (ref 3.87–5.11)
RDW: 16.2 % — ABNORMAL HIGH (ref 11.5–15.5)
WBC: 10.8 10*3/uL — ABNORMAL HIGH (ref 4.0–10.5)

## 2016-03-30 LAB — TYPE AND SCREEN
ABO/RH(D): AB NEG
Antibody Screen: NEGATIVE

## 2016-03-30 MED ORDER — EPHEDRINE 5 MG/ML INJ
10.0000 mg | INTRAVENOUS | Status: DC | PRN
  Filled 2016-03-30: qty 4

## 2016-03-30 MED ORDER — PENICILLIN G POT IN DEXTROSE 60000 UNIT/ML IV SOLN
3.0000 10*6.[IU] | INTRAVENOUS | Status: DC
Start: 2016-03-30 — End: 2016-03-31
  Administered 2016-03-30 (×2): 3 10*6.[IU] via INTRAVENOUS
  Filled 2016-03-30 (×7): qty 50

## 2016-03-30 MED ORDER — ONDANSETRON HCL 4 MG/2ML IJ SOLN: 4.0000 mg | Freq: Four times a day (QID) | INTRAMUSCULAR | Status: DC | PRN

## 2016-03-30 MED ORDER — ACETAMINOPHEN 325 MG PO TABS: 650.0000 mg | ORAL_TABLET | ORAL | Status: DC | PRN

## 2016-03-30 MED ORDER — PHENYLEPHRINE 40 MCG/ML (10ML) SYRINGE FOR IV PUSH (FOR BLOOD PRESSURE SUPPORT)
80.0000 ug | PREFILLED_SYRINGE | INTRAVENOUS | Status: DC | PRN
  Filled 2016-03-30: qty 5

## 2016-03-30 MED ORDER — OXYTOCIN 40 UNITS IN LACTATED RINGERS INFUSION - SIMPLE MED
2.5000 [IU]/h | INTRAVENOUS | Status: DC
  Filled 2016-03-30: qty 1000

## 2016-03-30 MED ORDER — OXYCODONE-ACETAMINOPHEN 5-325 MG PO TABS: 2.0000 | ORAL_TABLET | ORAL | Status: DC | PRN

## 2016-03-30 MED ORDER — OXYCODONE-ACETAMINOPHEN 5-325 MG PO TABS: 1.0000 | ORAL_TABLET | ORAL | Status: DC | PRN

## 2016-03-30 MED ORDER — FENTANYL 2.5 MCG/ML BUPIVACAINE 1/10 % EPIDURAL INFUSION (WH - ANES)
INTRAMUSCULAR | Status: AC
  Filled 2016-03-30: qty 100

## 2016-03-30 MED ORDER — PENICILLIN G POTASSIUM 5000000 UNITS IJ SOLR
5.0000 10*6.[IU] | Freq: Once | INTRAVENOUS | Status: AC
  Administered 2016-03-30: 5 10*6.[IU] via INTRAVENOUS
  Filled 2016-03-30: qty 5

## 2016-03-30 MED ORDER — LACTATED RINGERS IV SOLN
INTRAVENOUS | Status: DC
  Administered 2016-03-30 (×3): via INTRAVENOUS

## 2016-03-30 MED ORDER — LACTATED RINGERS IV SOLN: 500.0000 mL | INTRAVENOUS | Status: DC | PRN

## 2016-03-30 MED ORDER — DIPHENHYDRAMINE HCL 50 MG/ML IJ SOLN: 12.5000 mg | INTRAMUSCULAR | Status: DC | PRN

## 2016-03-30 MED ORDER — FENTANYL 2.5 MCG/ML BUPIVACAINE 1/10 % EPIDURAL INFUSION (WH - ANES)
14.0000 mL/h | INTRAMUSCULAR | Status: DC | PRN
  Administered 2016-03-30 (×3): 14 mL/h via EPIDURAL
  Filled 2016-03-30: qty 100

## 2016-03-30 MED ORDER — LIDOCAINE HCL (PF) 1 % IJ SOLN
INTRAMUSCULAR | Status: DC | PRN
  Administered 2016-03-30 (×2): 6 mL via EPIDURAL

## 2016-03-30 MED ORDER — SOD CITRATE-CITRIC ACID 500-334 MG/5ML PO SOLN: 30.0000 mL | ORAL | Status: DC | PRN

## 2016-03-30 MED ORDER — LACTATED RINGERS IV SOLN
500.0000 mL | Freq: Once | INTRAVENOUS | Status: AC
  Administered 2016-03-30: 500 mL via INTRAVENOUS

## 2016-03-30 MED ORDER — TERBUTALINE SULFATE 1 MG/ML IJ SOLN
0.2500 mg | Freq: Once | INTRAMUSCULAR | Status: DC | PRN
  Filled 2016-03-30: qty 1

## 2016-03-30 MED ORDER — BUTORPHANOL TARTRATE 1 MG/ML IJ SOLN
2.0000 mg | Freq: Once | INTRAMUSCULAR | Status: AC
  Administered 2016-03-30: 2 mg via INTRAVENOUS
  Filled 2016-03-30 (×2): qty 2

## 2016-03-30 MED ORDER — PHENYLEPHRINE 40 MCG/ML (10ML) SYRINGE FOR IV PUSH (FOR BLOOD PRESSURE SUPPORT)
PREFILLED_SYRINGE | INTRAVENOUS | Status: AC
  Filled 2016-03-30: qty 10

## 2016-03-30 MED ORDER — OXYTOCIN 40 UNITS IN LACTATED RINGERS INFUSION - SIMPLE MED
1.0000 m[IU]/min | INTRAVENOUS | Status: DC
  Administered 2016-03-30: 4 m[IU]/min via INTRAVENOUS
  Administered 2016-03-30: 16 m[IU]/min via INTRAVENOUS
  Administered 2016-03-30: 2 m[IU]/min via INTRAVENOUS

## 2016-03-30 MED ORDER — LIDOCAINE HCL (PF) 1 % IJ SOLN
30.0000 mL | INTRAMUSCULAR | Status: DC | PRN
  Filled 2016-03-30: qty 30

## 2016-03-30 MED ORDER — OXYTOCIN BOLUS FROM INFUSION
500.0000 mL | Freq: Once | INTRAVENOUS | Status: AC
  Administered 2016-03-30: 500 mL via INTRAVENOUS

## 2016-03-30 NOTE — Progress Notes (Signed)
Dr Cherly Hensenousins notified of pt's arrival, VE, orders received to admit pt

## 2016-03-30 NOTE — Progress Notes (Signed)
Notified of pt arrival in MAU and exam. Ok to discharge home with labor precautions. 

## 2016-03-30 NOTE — MAU Note (Signed)
PT SAYS HURT BAD   WITH UC  SINCE  2300.   LAST WED  - DR [name]-  VE  CLOSED .   DENIES HSV AND MRSA.     GBS- POSITIVE

## 2016-03-30 NOTE — H&P (Signed)
OB ADMISSION/ HISTORY & PHYSICAL:  Admission Date: 03/30/2016  8:44 AM  Admit Diagnosis: Term pregnancy in active labor  Alison Schultz is a 26 y.o. female presenting for contractions. Ctx painful through the night, more intense this AM. Was seen in MAU last night and exam 1 cm.  + FM, no LOF  Prenatal History: G1P0   EDC : 04/09/2016, by [facility]  [facility].  Prenatal course complicated by GBS positive, Rh neg, Rubella non-immune Rhogam given 01/20/16  Prenatal Labs: ABO, Rh:   AB neg Antibody: NEG (01/09 0903) Rubella:   non-imm RPR:   NR HBsAg:   neg HIV:   neg GBS:   pos 1 hr Glucola : 140   Medical / Surgical History :  Past medical history:  Past Medical History:  Diagnosis Date  . Medical history non-contributory      Past surgical history:  Past Surgical History:  Procedure Laterality Date  . NO PAST SURGERIES       Family History: History reviewed. No pertinent family history.   Social History:  reports that she has never smoked. She has never used smokeless tobacco. She reports that she does not drink alcohol or use drugs.   Allergies: Patient has no known allergies.    Current Medications at time of admission:  No prescriptions prior to admission.      Review of Systems: ROS  As noted above   Physical Exam:  Dilation: 5.5 Effacement (%): 80 Station: -2 Exam by:: L Seymour RN Vitals:   03/30/16 1051 03/30/16 1100  BP: 139/72 (!) 152/84  Pulse: 89 (!) 114  Resp: 18   Temp:      General: AAO x 3, breathing w/ ctx Heart:RRR Lungs:CTAB Abdomen:gravid, NT Extremities: trace edema Genitalia / VE: deferred, exam per RN FHR: 150, mod var, + accels, no decels TOCO:Q 4 min, mod to palp  Labs:     Recent Labs  03/30/16 0903  WBC 10.8*  HGB 11.2*  HCT 33.6*  PLT 249     Assessment:  26 y.o. G1P0 at 5264w4d  1. Labor: active 2. Fetal Wellbeing: Category 1  3. Pain Control:  Stadol given, epidural prep in process 4. GBS: Positive  Plan:  1. Admit to BS 2. Routine L&D orders 3. Analgesia/anesthesia PRN  4. GBS prophylaxis started per protocol    Consultant: Dr. Cherly Hensenousins CNM management at MD request.     Neta Mendsaniela C Purvi Ruehl, CNM, MSN 03/30/2016, 11:01 AM

## 2016-03-30 NOTE — Anesthesia Procedure Notes (Signed)
Epidural Patient location during procedure: OB Start time: 03/30/2016 10:08 AM End time: 03/30/2016 10:11 AM  Staffing Anesthesiologist: Leilani AbleHATCHETT, Wilburn Keir Performed: anesthesiologist   Preanesthetic Checklist Completed: patient identified, surgical consent, pre-op evaluation, timeout performed, IV checked, risks and benefits discussed and monitors and equipment checked  Epidural Patient position: sitting Prep: site prepped and draped and DuraPrep Patient monitoring: continuous pulse ox and blood pressure Approach: midline Location: L3-L4 Injection technique: LOR air  Needle:  Needle type: Tuohy  Needle gauge: 17 G Needle length: 9 cm and 9 Needle insertion depth: 7 cm Catheter type: closed end flexible Catheter size: 19 Gauge Catheter at skin depth: 10 cm Test dose: negative and Other  Assessment Sensory level: T9 Events: blood not aspirated, injection not painful, no injection resistance, negative IV test and no paresthesia  Additional Notes Reason for block:procedure for pain

## 2016-03-30 NOTE — Progress Notes (Signed)
S: complain of rectal and vaginal pressure   O: Pitocin VE right cervix edematous( rim) /0 station Asynclitic. Ice pack placed in vagina Tracing: baseline 150 (+) accel (+) early decel Ctx  q 1 1/2-3 mins  IMP: Active phase limited by asynclitism P) right exaggerated sims. Cont with pitocin

## 2016-03-30 NOTE — Progress Notes (Signed)
Alison Schultz is a 26 y.o. G1P0 at 8858w4d by ultrasound admitted for active labor  Subjective: Comfortable post epidural, [facility]   Objective: Vitals:   03/30/16 1045 03/30/16 1048 03/30/16 1051 03/30/16 1100  BP:  (!) 141/80 139/72 (!) 152/84  Pulse: (!) 105 95 89 (!) 114  Resp:  18 18   Temp:      TempSrc:      SpO2:  100%      No intake/output data recorded. No intake/output data recorded.   FHT:  FHR: 140 bpm, variability: moderate,  accelerations:  Present,  decelerations:  Absent UC:   regular, every 4-6 minutes SVE:   Dilation: 8 Effacement (%): 90 Station: -2 Exam by:: Colon Flattery. Paul, CNm  Labs:   Recent Labs  03/30/16 0903  WBC 10.8*  HGB 11.2*  HCT 33.6*  PLT 249    Assessment / Plan: Spontaneous labor, progressing normally  Labor: Progressing normally Preeclampsia:  no signs or symptoms of toxicity, labile BP Fetal Wellbeing:  Category I Pain Control:  Epidural I/D:  GBS prophylaxis ongoing, PCN fiirst dose in Anticipated MOD:  NSVD   Will update Dr. Cherly Hensenousins w/ pt. status  Neta Mendsaniela C Paul, CNM, MSN 03/30/2016, 11:22 AM

## 2016-03-30 NOTE — Progress Notes (Signed)
S; comfortable  Epidural  O: VE 6/80/-2 deviated to left  Tracing: baseline 140 (+) accel to 160 Ctx q 6 mins  IMP: arrest of dilation due to suboptimal ctx GBS cx (+) on IV PCN P) start pitocin augmentation.  Left exaggerated sims

## 2016-03-30 NOTE — Anesthesia Preprocedure Evaluation (Signed)
Anesthesia Evaluation  Patient identified by MRN, date of birth, ID band Patient awake    Reviewed: Allergy & Precautions, H&P , NPO status , Patient's Chart, lab work & pertinent test results  Airway Mallampati: I  TM Distance: >3 FB Neck ROM: full    Dental no notable dental hx.    Pulmonary neg pulmonary ROS,    Pulmonary exam normal        Cardiovascular negative cardio ROS Normal cardiovascular exam     Neuro/Psych negative neurological ROS  negative psych ROS   GI/Hepatic negative GI ROS, Neg liver ROS,   Endo/Other  negative endocrine ROS  Renal/GU negative Renal ROS     Musculoskeletal   Abdominal (+) + obese,   Peds  Hematology negative hematology ROS (+)   Anesthesia Other Findings   Reproductive/Obstetrics (+) Pregnancy                             Anesthesia Physical Anesthesia Plan  ASA: II  Anesthesia Plan: Epidural   Post-op Pain Management:    Induction:   Airway Management Planned:   Additional Equipment:   Intra-op Plan:   Post-operative Plan:   Informed Consent: I have reviewed the patients History and Physical, chart, labs and discussed the procedure including the risks, benefits and alternatives for the proposed anesthesia with the patient or authorized representative who has indicated his/her understanding and acceptance.     Plan Discussed with:   Anesthesia Plan Comments:         Anesthesia Quick Evaluation  

## 2016-03-30 NOTE — Progress Notes (Signed)
S: tired Comfortable  epidural Pitocin 16 miu  O: BP 120/74   Pulse (!) 122   Temp 98.2 F (36.8 C) (Oral)   Resp 16   LMP 07/09/2015   SpO2 100%  VE unchanged AROM light mec IUPC/ISE  IMP: arrest of dilation due to sub optimal ctx GBS cx (+) on PCN Term  P) cont with pitocin. Exaggerated sims position

## 2016-03-30 NOTE — Anesthesia Pain Management Evaluation Note (Signed)
  CRNA Pain Management Visit Note  Patient: Alison Schultz, 26 y.o., female  "Hello I am a member of the anesthesia team at [facility]. We have an anesthesia team available at all times to provide care throughout the hospital, including epidural management and anesthesia for C-section. I don't know your plan for the delivery whether it a natural birth, water birth, IV sedation, nitrous supplementation, doula or epidural, but we want to meet your pain goals."   1.Was your pain managed to your expectations on prior hospitalizations?   Unable to assess - patient sleeping  2.What is your expectation for pain management during this hospitalization?     Epidural  3.How can we help you reach that goal? epidural  Record the patient's initial score and the patient's pain goal.   Pain: Patient sleeping - unable to assess  Pain Goal: Patient sleeping - unable to assess [facility] wants you to be able to say your pain was always managed very well.  [name] 03/30/2016

## 2016-03-30 NOTE — Discharge Instructions (Signed)
Third Trimester of Pregnancy °The third trimester is from week 29 through week 40 (months 7 through 9). The third trimester is a time when the unborn baby (fetus) is growing rapidly. At the end of the ninth month, the fetus is about 20 inches in length and weighs 6-10 pounds. °Body changes during your third trimester °Your body goes through many changes during pregnancy. The changes vary from woman to woman. During the third trimester: °· Your weight will continue to increase. You can expect to gain 25-35 pounds (11-16 kg) by the end of the pregnancy. °· You may begin to get stretch marks on your hips, abdomen, and breasts. °· You may urinate more often because the fetus is moving lower into your pelvis and pressing on your bladder. °· You may develop or continue to have heartburn. This is caused by increased hormones that slow down muscles in the digestive tract. °· You may develop or continue to have constipation because increased hormones slow digestion and cause the muscles that push waste through your intestines to relax. °· You may develop hemorrhoids. These are swollen veins (varicose veins) in the rectum that can itch or be painful. °· You may develop swollen, bulging veins (varicose veins) in your legs. °· You may have increased body aches in the pelvis, back, or thighs. This is due to weight gain and increased hormones that are relaxing your joints. °· You may have changes in your hair. These can include thickening of your hair, rapid growth, and changes in texture. Some women also have hair loss during or after pregnancy, or hair that feels dry or thin. Your hair will most likely return to normal after your baby is born. °· Your breasts will continue to grow and they will continue to become tender. A yellow fluid (colostrum) may leak from your breasts. This is the first milk you are producing for your baby. °· Your belly button may stick out. °· You may notice more swelling in your hands, face, or  ankles. °· You may have increased tingling or numbness in your hands, arms, and legs. The skin on your belly may also feel numb. °· You may feel short of breath because of your expanding uterus. °· You may have more problems sleeping. This can be caused by the size of your belly, increased need to urinate, and an increase in your body's metabolism. °· You may notice the fetus "dropping," or moving lower in your abdomen. °· You may have increased vaginal discharge. °· Your cervix becomes thin and soft (effaced) near your due date. °What to expect at prenatal visits °You will have prenatal exams every 2 weeks until week 36. Then you will have weekly prenatal exams. During a routine prenatal visit: °· You will be weighed to make sure you and the fetus are growing normally. °· Your blood pressure will be taken. °· Your abdomen will be measured to track your baby's growth. °· The fetal heartbeat will be listened to. °· Any test results from the previous visit will be discussed. °· You may have a cervical check near your due date to see if you have effaced. °At around 36 weeks, your health care provider will check your cervix. At the same time, your health care provider will also perform a test on the secretions of the vaginal tissue. This test is to determine if a type of bacteria, Group B streptococcus, is present. Your health care provider will explain this further. °Your health care provider may ask you: °·   What your birth plan is. °· How you are feeling. °· If you are feeling the baby move. °· If you have had any abnormal symptoms, such as leaking fluid, bleeding, severe headaches, or abdominal cramping. °· If you are using any tobacco products, including cigarettes, chewing tobacco, and electronic cigarettes. °· If you have any questions. °Other tests or screenings that may be performed during your third trimester include: °· Blood tests that check for low iron levels (anemia). °· Fetal testing to check the health,  activity level, and growth of the fetus. Testing is done if you have certain medical conditions or if there are problems during the pregnancy. °· Nonstress test (NST). This test checks the health of your baby to make sure there are no signs of problems, such as the baby not getting enough oxygen. During this test, a belt is placed around your belly. The baby is made to move, and its heart rate is monitored during movement. °What is false labor? °False labor is a condition in which you feel small, irregular tightenings of the muscles in the womb (contractions) that eventually go away. These are called Braxton Hicks contractions. Contractions may last for hours, days, or even weeks before true labor sets in. If contractions come at regular intervals, become more frequent, increase in intensity, or become painful, you should see your health care provider. °What are the signs of labor? °· Abdominal cramps. °· Regular contractions that start at 10 minutes apart and become stronger and more frequent with time. °· Contractions that start on the top of the uterus and spread down to the lower abdomen and back. °· Increased pelvic pressure and dull back pain. °· A watery or bloody mucus discharge that comes from the vagina. °· Leaking of amniotic fluid. This is also known as your "water breaking." It could be a slow trickle or a gush. Let your doctor know if it has a color or strange odor. °If you have any of these signs, call your health care provider right away, even if it is before your due date. °Follow these instructions at home: °Eating and drinking °· Continue to eat regular, healthy meals. °· Do not eat: °¨ Raw meat or meat spreads. °¨ Unpasteurized milk or cheese. °¨ Unpasteurized juice. °¨ Store-made salad. °¨ Refrigerated smoked seafood. °¨ Hot dogs or deli meat, unless they are piping hot. °¨ More than 6 ounces of albacore tuna a week. °¨ Shark, swordfish, king mackerel, or tile fish. °¨ Store-made salads. °¨ Raw  sprouts, such as mung bean or alfalfa sprouts. °· Take prenatal vitamins as told by your health care provider. °· Take 1000 mg of calcium daily as told by your health care provider. °· If you develop constipation: °¨ Take over-the-counter or prescription medicines. °¨ Drink enough fluid to keep your urine clear or pale yellow. °¨ Eat foods that are high in fiber, such as fresh fruits and vegetables, whole grains, and beans. °¨ Limit foods that are high in fat and processed sugars, such as fried and sweet foods. °Activity °· Exercise only as directed by your health care provider. Healthy pregnant women should aim for 2 hours and 30 minutes of moderate exercise per week. If you experience any pain or discomfort while exercising, stop. °· Avoid heavy lifting. °· Do not exercise in extreme heat or humidity, or at high altitudes. °· Wear low-heel, comfortable shoes. °· Practice good posture. °· Do not travel far distances unless it is absolutely necessary and only with the approval   of your health care provider. °· Wear your seat belt at all times while in a car, on a bus, or on a plane. °· Take frequent breaks and rest with your legs elevated if you have leg cramps or low back pain. °· Do not use hot tubs, steam rooms, or saunas. °· You may continue to have sex unless your health care provider tells you otherwise. °Lifestyle °· Do not use any products that contain nicotine or tobacco, such as cigarettes and e-cigarettes. If you need help quitting, ask your health care provider. °· Do not drink alcohol. °· Do not use any medicinal herbs or unprescribed drugs. These chemicals affect the formation and growth of the baby. °· If you develop varicose veins: °¨ Wear support pantyhose or compression stockings as told by your healthcare provider. °¨ Elevate your feet for 15 minutes, 3-4 times a day. °· Wear a supportive maternity bra to help with breast tenderness. °General instructions °· Take over-the-counter and prescription  medicines only as told by your health care provider. There are medicines that are either safe or unsafe to take during pregnancy. °· Take warm sitz baths to soothe any pain or discomfort caused by hemorrhoids. Use hemorrhoid cream or witch hazel if your health care provider approves. °· Avoid cat litter boxes and soil used by cats. These carry germs that can cause birth defects in the baby. If you have a cat, ask someone to clean the litter box for you. °· To prepare for the arrival of your baby: °¨ Take prenatal classes to understand, practice, and ask questions about the labor and delivery. °¨ Make a trial run to the hospital. °¨ Visit the hospital and tour the maternity area. °¨ Arrange for maternity or paternity leave through employers. °¨ Arrange for family and friends to take care of pets while you are in the hospital. °¨ Purchase a rear-facing car seat and make sure you know how to install it in your car. °¨ Pack your hospital bag. °¨ Prepare the baby’s nursery. Make sure to remove all pillows and stuffed animals from the baby's crib to prevent suffocation. °· Visit your dentist if you have not gone during your pregnancy. Use a soft toothbrush to brush your teeth and be gentle when you floss. °· Keep all prenatal follow-up visits as told by your health care provider. This is important. °Contact a health care provider if: °· You are unsure if you are in labor or if your water has broken. °· You become dizzy. °· You have mild pelvic cramps, pelvic pressure, or nagging pain in your abdominal area. °· You have lower back pain. °· You have persistent nausea, vomiting, or diarrhea. °· You have an unusual or bad smelling vaginal discharge. °· You have pain when you urinate. °Get help right away if: °· You have a fever. °· You are leaking fluid from your vagina. °· You have spotting or bleeding from your vagina. °· You have severe abdominal pain or cramping. °· You have rapid weight loss or weight gain. °· You have  shortness of breath with chest pain. °· You notice sudden or extreme swelling of your face, hands, ankles, feet, or legs. °· Your baby makes fewer than 10 movements in 2 hours. °· You have severe headaches that do not go away with medicine. °· You have vision changes. °Summary °· The third trimester is from week 29 through week 40, months 7 through 9. The third trimester is a time when the unborn baby (fetus)   is growing rapidly. °· During the third trimester, your discomfort may increase as you and your baby continue to gain weight. You may have abdominal, leg, and back pain, sleeping problems, and an increased need to urinate. °· During the third trimester your breasts will keep growing and they will continue to become tender. A yellow fluid (colostrum) may leak from your breasts. This is the first milk you are producing for your baby. °· False labor is a condition in which you feel small, irregular tightenings of the muscles in the womb (contractions) that eventually go away. These are called Braxton Hicks contractions. Contractions may last for hours, days, or even weeks before true labor sets in. °· Signs of labor can include: abdominal cramps; regular contractions that start at 10 minutes apart and become stronger and more frequent with time; watery or bloody mucus discharge that comes from the vagina; increased pelvic pressure and dull back pain; and leaking of amniotic fluid. °This information is not intended to replace advice given to you by your health care provider. Make sure you discuss any questions you have with your health care provider. °Document Released: 03/02/2001 Document Revised: 08/14/2015 Document Reviewed: 05/09/2012 °Elsevier Interactive Patient Education © 2017 [employer] °Introduction °Patient Name: ________________________________________________ Patient Due Date: ____________________ °What is a fetal movement count? °A fetal movement count is the number of times that you feel your baby  move during a certain amount of time. This may also be called a fetal kick count. A fetal movement count is recommended for every pregnant woman. You may be asked to start counting fetal movements as early as week 28 of your pregnancy. °Pay attention to when your baby is most active. You may notice your baby's sleep and wake cycles. You may also notice things that make your baby move more. You should do a fetal movement count: °· When your baby is normally most active. °· At the same time each day. °A good time to count movements is while you are resting, after having something to eat and drink. °How do I count fetal movements? °1. Find a quiet, comfortable area. Sit, or lie down on your side. °2. Write down the date, the start time and stop time, and the number of movements that you felt between those two times. Take this information with you to your health care visits. °3. For 2 hours, count kicks, flutters, swishes, rolls, and jabs. You should feel at least 10 movements during 2 hours. °4. You may stop counting after you have felt 10 movements. °5. If you do not feel 10 movements in 2 hours, have something to eat and drink. Then, keep resting and counting for 1 hour. If you feel at least 4 movements during that hour, you may stop counting. °Contact a health care provider if: °· You feel fewer than 4 movements in 2 hours. °· Your baby is not moving like he or she usually does. °Date: ____________ Start time: ____________ Stop time: ____________ Movements: ____________ °Date: ____________ Start time: ____________ Stop time: ____________ Movements: ____________ °Date: ____________ Start time: ____________ Stop time: ____________ Movements: ____________ °Date: ____________ Start time: ____________ Stop time: ____________ Movements: ____________ °Date: ____________ Start time: ____________ Stop time: ____________ Movements: ____________ °Date: ____________ Start time: ____________ Stop time: ____________ Movements:  ____________ °Date: ____________ Start time: ____________ Stop time: ____________ Movements: ____________ °Date: ____________ Start time: ____________ Stop time: ____________ Movements: ____________ °Date: ____________ Start time: ____________ Stop time: ____________ Movements: ____________ °This information is not intended to replace   advice given to you by your health care provider. Make sure you discuss any questions you have with your health care provider. °Document Released: 04/07/2006 Document Revised: 11/05/2015 Document Reviewed: 04/17/2015 °Elsevier Interactive Patient Education © 2017 [employer] °Braxton Hicks Contractions °Contractions of the uterus can occur throughout pregnancy. Contractions are not always a sign that you are in labor.  °WHAT ARE BRAXTON HICKS CONTRACTIONS?  °Contractions that occur before labor are called Braxton Hicks contractions, or false labor. Toward the end of pregnancy (32-34 weeks), these contractions can develop more often and may become more forceful. This is not true labor because these contractions do not result in opening (dilatation) and thinning of the cervix. They are sometimes difficult to tell apart from true labor because these contractions can be forceful and people have different pain tolerances. You should not feel embarrassed if you go to the hospital with false labor. Sometimes, the only way to tell if you are in true labor is for your health care provider to look for changes in the cervix. °If there are no prenatal problems or other health problems associated with the pregnancy, it is completely safe to be sent home with false labor and await the onset of true labor. °HOW CAN YOU TELL THE DIFFERENCE BETWEEN TRUE AND FALSE LABOR? °False Labor  °· The contractions of false labor are usually shorter and not as hard as those of true labor.   °· The contractions are usually irregular.   °· The contractions are often felt in the front of the lower abdomen and in  the groin.   °· The contractions may go away when you walk around or change positions while lying down.   °· The contractions get weaker and are shorter lasting as time goes on.   °· The contractions do not usually become progressively stronger, regular, and closer together as with true labor.   °True Labor  °· Contractions in true labor last 30-70 seconds, become very regular, usually become more intense, and increase in frequency.   °· The contractions do not go away with walking.   °· The discomfort is usually felt in the top of the uterus and spreads to the lower abdomen and low back.   °· True labor can be determined by your health care provider with an exam. This will show that the cervix is dilating and getting thinner.   °WHAT TO REMEMBER °· Keep up with your usual exercises and follow other instructions given by your health care provider.   °· Take medicines as directed by your health care provider.   °· Keep your regular prenatal appointments.   °· Eat and drink lightly if you think you are going into labor.   °· If Braxton Hicks contractions are making you uncomfortable:   °¨ Change your position from lying down or resting to walking, or from walking to resting.   °¨ Sit and rest in a tub of warm water.   °¨ Drink 2-3 glasses of water. Dehydration may cause these contractions.   °¨ Do slow and deep breathing several times an hour.   °WHEN SHOULD I SEEK IMMEDIATE MEDICAL CARE? °Seek immediate medical care if: °· Your contractions become stronger, more regular, and closer together.   °· You have fluid leaking or gushing from your vagina.   °· You have a fever.   °· You pass blood-tinged mucus.   °· You have vaginal bleeding.   °· You have continuous abdominal pain.   °· You have low back pain that you never had before.   °· You feel your baby's head pushing down and causing pelvic pressure.   °· Your   baby is not moving as much as it used to.   °This information is not intended to replace advice given to you  by your health care provider. Make sure you discuss any questions you have with your health care provider. °Document Released: 03/08/2005 Document Revised: 06/30/2015 Document Reviewed: 12/18/2012 °Elsevier Interactive Patient Education © 2017 [employer] ° °

## 2016-03-30 NOTE — MAU Note (Signed)
Pt presents to MAU with complaints of contractions that have gotten worse throughout the night. PT was evaluated in MAU last night and was dilated 1 cm. Denies any vaginal bleeding or LOF

## 2016-03-31 ENCOUNTER — Encounter (HOSPITAL_COMMUNITY): Payer: Self-pay

## 2016-03-31 DIAGNOSIS — D62 Acute posthemorrhagic anemia: Secondary | ICD-10-CM

## 2016-03-31 HISTORY — DX: Acute posthemorrhagic anemia: D62

## 2016-03-31 LAB — CBC
HEMATOCRIT: 27 % — AB (ref 36.0–46.0)
Hemoglobin: 8.8 g/dL — ABNORMAL LOW (ref 12.0–15.0)
MCH: 26.6 pg (ref 26.0–34.0)
MCHC: 32.6 g/dL (ref 30.0–36.0)
MCV: 81.6 fL (ref 78.0–100.0)
PLATELETS: 186 10*3/uL (ref 150–400)
RBC: 3.31 MIL/uL — ABNORMAL LOW (ref 3.87–5.11)
RDW: 16.6 % — AB (ref 11.5–15.5)
WBC: 16.5 10*3/uL — ABNORMAL HIGH (ref 4.0–10.5)

## 2016-03-31 LAB — RPR: RPR Ser Ql: NONREACTIVE

## 2016-03-31 MED ORDER — RHO D IMMUNE GLOBULIN 1500 UNIT/2ML IJ SOSY
300.0000 ug | PREFILLED_SYRINGE | Freq: Once | INTRAMUSCULAR | Status: AC
  Administered 2016-03-31: 300 ug via INTRAVENOUS
  Filled 2016-03-31: qty 2

## 2016-03-31 MED ORDER — DIBUCAINE 1 % RE OINT
1.0000 "application " | TOPICAL_OINTMENT | RECTAL | Status: DC | PRN
  Administered 2016-03-31: 1 via RECTAL
  Filled 2016-03-31: qty 28

## 2016-03-31 MED ORDER — SENNOSIDES-DOCUSATE SODIUM 8.6-50 MG PO TABS
2.0000 | ORAL_TABLET | ORAL | Status: DC
  Administered 2016-03-31 – 2016-04-01 (×2): 2 via ORAL
  Filled 2016-03-31 (×2): qty 2

## 2016-03-31 MED ORDER — WITCH HAZEL-GLYCERIN EX PADS: 1.0000 "application " | MEDICATED_PAD | CUTANEOUS | Status: DC | PRN

## 2016-03-31 MED ORDER — COCONUT OIL OIL: 1.0000 "application " | TOPICAL_OIL | Status: DC | PRN

## 2016-03-31 MED ORDER — ACETAMINOPHEN 325 MG PO TABS
650.0000 mg | ORAL_TABLET | ORAL | Status: DC | PRN
  Administered 2016-03-31: 650 mg via ORAL
  Filled 2016-03-31: qty 2

## 2016-03-31 MED ORDER — MEASLES, MUMPS & RUBELLA VAC ~~LOC~~ INJ
0.5000 mL | INJECTION | Freq: Once | SUBCUTANEOUS | Status: AC
  Administered 2016-04-01: 0.5 mL via SUBCUTANEOUS
  Filled 2016-03-31 (×2): qty 0.5

## 2016-03-31 MED ORDER — ZOLPIDEM TARTRATE 5 MG PO TABS: 5.0000 mg | ORAL_TABLET | Freq: Every evening | ORAL | Status: DC | PRN

## 2016-03-31 MED ORDER — DIPHENHYDRAMINE HCL 25 MG PO CAPS: 25.0000 mg | ORAL_CAPSULE | Freq: Four times a day (QID) | ORAL | Status: DC | PRN

## 2016-03-31 MED ORDER — MAGNESIUM OXIDE 400 (241.3 MG) MG PO TABS
400.0000 mg | ORAL_TABLET | Freq: Every day | ORAL | Status: DC
  Filled 2016-03-31 (×3): qty 1

## 2016-03-31 MED ORDER — FERROUS SULFATE 325 (65 FE) MG PO TABS
325.0000 mg | ORAL_TABLET | Freq: Two times a day (BID) | ORAL | Status: DC
  Administered 2016-03-31 (×3): 325 mg via ORAL
  Filled 2016-03-31 (×2): qty 1

## 2016-03-31 MED ORDER — BENZOCAINE-MENTHOL 20-0.5 % EX AERO
1.0000 "application " | INHALATION_SPRAY | CUTANEOUS | Status: DC | PRN
  Administered 2016-03-31: 1 via TOPICAL
  Filled 2016-03-31: qty 56

## 2016-03-31 MED ORDER — IBUPROFEN 600 MG PO TABS
600.0000 mg | ORAL_TABLET | Freq: Four times a day (QID) | ORAL | Status: DC
  Administered 2016-03-31 – 2016-04-01 (×4): 600 mg via ORAL
  Filled 2016-03-31 (×4): qty 1

## 2016-03-31 MED ORDER — PRENATAL MULTIVITAMIN CH
1.0000 | ORAL_TABLET | Freq: Every day | ORAL | Status: DC
Start: 2016-03-31 — End: 2016-04-01
  Administered 2016-03-31: 1 via ORAL
  Filled 2016-03-31: qty 1

## 2016-03-31 MED ORDER — ONDANSETRON HCL 4 MG/2ML IJ SOLN: 4.0000 mg | INTRAMUSCULAR | Status: DC | PRN

## 2016-03-31 MED ORDER — MEASLES, MUMPS & RUBELLA VAC ~~LOC~~ INJ
0.5000 mL | INJECTION | Freq: Once | SUBCUTANEOUS | Status: DC
  Filled 2016-03-31: qty 0.5

## 2016-03-31 MED ORDER — SIMETHICONE 80 MG PO CHEW: 80.0000 mg | CHEWABLE_TABLET | ORAL | Status: DC | PRN

## 2016-03-31 MED ORDER — FERROUS SULFATE 325 (65 FE) MG PO TABS
325.0000 mg | ORAL_TABLET | Freq: Two times a day (BID) | ORAL | Status: DC
  Filled 2016-03-31: qty 1

## 2016-03-31 MED ORDER — ONDANSETRON HCL 4 MG PO TABS: 4.0000 mg | ORAL_TABLET | ORAL | Status: DC | PRN

## 2016-03-31 NOTE — Progress Notes (Signed)
Patient ID: Alison Schultz, female   DOB: [birth year], 26 y.o.   MRN: [id number] PPD # 1 SVD  S:  Reports feeling well.             Tolerating po/ No nausea or vomiting             Bleeding is moderate             Pain controlled with ibuprofen (OTC)             Up ad lib / ambulatory / voiding without difficulties     Information for the patient's newborn:  Belen, Boy Deyanira [[id number]]  female    breast feeding  / Circumcision planning   O:  A & O x 3, in no apparent distress              VS:  Vitals:   03/31/16 1627 03/31/16 1800 03/31/16 1942 04/01/16 0559  BP: 134/74 135/77  132/68  Pulse: 88 82  87  Resp: 18 18  18   Temp: 98.5 F (36.9 C) 98.3 F (36.8 C) 98.5 F (36.9 C) 98.4 F (36.9 C)  TempSrc: Oral Oral Oral Oral  SpO2: 100%       LABS:   Recent Labs  03/30/16 0903 03/31/16 0533  WBC 10.8* 16.5*  HGB 11.2* 8.8*  HCT 33.6* 27.0*  PLT 249 186    Blood type: --/--/AB NEG (01/10 0533) / Infant Rh POS / Rhophylac indicated  Rubella: Immune (06/26 0000)   I&O: I/O last 3 completed shifts: In: -  Out: 300 [Blood:300]          No intake/output data recorded.    Abdomen: soft, non-tender, non-distended             Fundus: firm, non-tender, U-even  Perineum: intac, no edema  Lochia: minimal  Extremities: no edema, no calf pain or tenderness    A/P: PPD # 1 26 y.o., G1P1001   Principal Problem:   Postpartum care following vaginal delivery (1/9) Active Problems:   GBS (group B streptococcus) UTI complicating pregnancy   Labor and delivery indication for care or intervention   Rh negative, maternal   Rubella non-immune status, antepartum   Acute blood loss anemia   Status post vacuum-assisted vaginal delivery    Doing well - stable status  Routine post partum orders  Anticipate discharge tomorrow    Raelyn MoraAWSON, Khaleb Broz, M, MSN, CNM 04/01/2016, 9:20 AM

## 2016-03-31 NOTE — Lactation Note (Signed)
This note was copied from a baby's chart. Lactation Consultation Note  Patient Name: Alison Schultz Today's Date: 03/31/2016 Reason for consult: Initial assessment   With this first time mom and term baby, weight just under 6 pounds. The baby was being held skin to skin, and mom was not able to get baby latched. He is now 5810 hours old. Mom states he has not  fed since birth. I assisted mom with cross cradle hold, and the baby latched easily, but stayed asleep once latched. Mom agreed to me showing her hand expression, and I was able to express 5 ml's and spoon fed this to [name]. He was placed skin to skin after feeding, burped and went back to sleep. Basic teaching  Done on breast feeding, and lactation services reviewed . Mom knows to call for questions/conerns.    Maternal Data Formula Feeding for Exclusion: No Has patient been taught Hand Expression?: Yes Does the patient have breastfeeding experience prior to this delivery?: No  Feeding Feeding Type: Breast Milk Length of feed: 5 min  LATCH Score/Interventions Latch: Too sleepy or reluctant, no latch achieved, no sucking elicited. Intervention(s): Adjust position;Assist with latch  Audible Swallowing: None (mom has lots of easily expressed colostrum)  Type of Nipple: Everted at rest and after stimulation  Comfort (Breast/Nipple): Soft / non-tender     Hold (Positioning): Assistance needed to correctly position infant at breast and maintain latch. Intervention(s): Breastfeeding basics reviewed;Support Pillows;Position options;Skin to skin  LATCH Score: 5  Lactation Tools Discussed/Used     Consult Status Consult Status: Follow-up Date: 04/01/16 Follow-up type: In-patient    Alfred LevinsLee, Harjit Douds Anne 03/31/2016, 10:23 AM

## 2016-03-31 NOTE — Anesthesia Postprocedure Evaluation (Signed)
Anesthesia Post Note  Patient: Alison Schultz  Procedure(s) Performed: * No procedures listed *  Patient location during evaluation: Mother Baby Anesthesia Type: Epidural Level of consciousness: awake Pain management: pain level controlled Vital Signs Assessment: post-procedure vital signs reviewed and stable Respiratory status: spontaneous breathing Cardiovascular status: stable Postop Assessment: no headache, no backache, epidural receding and patient able to bend at knees Anesthetic complications: no        Last Vitals:  Vitals:   03/31/16 0153 03/31/16 0500  BP: 139/72 118/78  Pulse: (!) 116 92  Resp: 16 16  Temp: 37.9 C 36.7 C    Last Pain:  Vitals:   03/31/16 0529  TempSrc:   PainSc: 0-No pain   Pain Goal:                 Edison PaceWILKERSON,Maritza Hosterman

## 2016-03-31 NOTE — Progress Notes (Signed)
Reported elevated temp to D. Renae FicklePaul CNM. CNM advised to give tylenol/motrin and follow.

## 2016-04-01 ENCOUNTER — Encounter (HOSPITAL_COMMUNITY): Payer: Self-pay | Admitting: Obstetrics and Gynecology

## 2016-04-01 DIAGNOSIS — Z8759 Personal history of other complications of pregnancy, childbirth and the puerperium: Secondary | ICD-10-CM

## 2016-04-01 HISTORY — DX: Personal history of other complications of pregnancy, childbirth and the puerperium: Z87.59

## 2016-04-01 LAB — RH IG WORKUP (INCLUDES ABO/RH)
ABO/RH(D): AB NEG
Fetal Screen: NEGATIVE
Gestational Age(Wks): 38
Unit division: 0

## 2016-04-01 MED ORDER — IBUPROFEN 600 MG PO TABS: 600.0000 mg | ORAL_TABLET | Freq: Four times a day (QID) | ORAL | 0 refills | Status: DC

## 2016-04-01 MED ORDER — MAGNESIUM OXIDE 400 (241.3 MG) MG PO TABS: 400.0000 mg | ORAL_TABLET | Freq: Every day | ORAL | Status: AC

## 2016-04-01 MED ORDER — BENZOCAINE-MENTHOL 20-0.5 % EX AERO: 1.0000 "application " | INHALATION_SPRAY | CUTANEOUS | Status: AC | PRN

## 2016-04-01 MED ORDER — FERROUS SULFATE 325 (65 FE) MG PO TABS: 325.0000 mg | ORAL_TABLET | Freq: Two times a day (BID) | ORAL | 3 refills | Status: AC

## 2016-04-01 MED ORDER — ACETAMINOPHEN 325 MG PO TABS: 650.0000 mg | ORAL_TABLET | ORAL | Status: DC | PRN

## 2016-04-01 MED ORDER — COCONUT OIL OIL: 1.0000 "application " | TOPICAL_OIL | 0 refills | Status: AC | PRN

## 2016-04-01 MED ORDER — PRENATAL MULTIVITAMIN CH: 1.0000 | ORAL_TABLET | Freq: Every day | ORAL | Status: AC

## 2016-04-01 NOTE — Discharge Summary (Signed)
Obstetric Discharge Summary Reason for Admission: onset of labor Prenatal Procedures: ultrasound Intrapartum Procedures: vacuum, GBS prophylaxis and epidural Postpartum Procedures: Rho(D) Ig and Rubella Ig Complications-Operative and Postpartum: none Hemoglobin  Date Value Ref Range Status  03/31/2016 8.8 (L) 12.0 - 15.0 g/dL Final    Comment:    DELTA CHECK NOTED REPEATED TO VERIFY    HCT  Date Value Ref Range Status  03/31/2016 27.0 (L) 36.0 - 46.0 % Final    Physical Exam:  General: alert, cooperative and no distress Lochia: appropriate Uterine Fundus: firm Incision: NA DVT Evaluation: No cords or calf tenderness. No significant calf/ankle edema.  Discharge Diagnoses: Term Pregnancy-delivered and acute blood loss anemia  Discharge Information: Date: 04/01/2016 Activity: pelvic rest Diet: routine Medications: PNV, Ibuprofen, Iron and Mag Ox Condition: stable Instructions: refer to practice specific booklet Discharge to: home Follow-up Information    Alison A, MD. Schedule an appointment as soon as possible for Schultz visit in 6 week(s).   Specialty:  Obstetrics and Gynecology Contact information: [address] [name] [id number] [phone]           Newborn Data: Live born female [name] Birth Weight: 5 lb 15.8 oz (2716 g) APGAR: 8, 9  Home with mother.  Alison Schultz 04/01/2016, 9:12 AM

## 2016-04-01 NOTE — Lactation Note (Signed)
This note was copied from a baby's chart. Lactation Consultation Note  Patient Name: Alison Schultz Today's Date: 04/01/2016 Reason for consult: Other (Comment);Follow-up assessment (3% weight loss, 5-12.6 oz , per mom thinks she is only going to bottle feed , but may pump when she gets home , see LC note ). Per mom has a DEBP Medela at home. LC discussed feeding options with mom. Since mom mentioned she may pump when she gets home.  LC discussed with it may be a totally different when her milk comes in, in the sense of latching. LC mentioned some times moms changing their mind when the milk comes in and they breast feed or pump and bottle feed . LC discussed supply and demand - if bottle feeding to pump and bottle feed every 2-3 hours ( at least 8 x's in 24 hours ) to establish and protect milk  Supply. LC reviewed sore nipple and engorgement prevention and tx.  Also reassured mom the [facility] services is a resource for mom whom breast feed and pump and bottle feed. Per mom has DEBP Medela at home. LC reminded mom to take her DEBP kit home with her.  Mother informed of post-discharge support and given phone number to the lactation department, including services for phone call assistance; out-patient appointments; and breastfeeding support group. List of other breastfeeding resources in the community given in the handout. Encouraged mother to call for problems or concerns related to breastfeeding.   Maternal Data    Feeding Feeding Type: Formula Nipple Type: Slow - flow  LATCH Score/Interventions                Intervention(s): Breastfeeding basics reviewed     Lactation Tools Discussed/Used     Consult Status Consult Status: Complete Date: 04/01/16    Jerlyn Ly Annibelle Brazie 04/01/2016, 10:06 AM

## 2016-04-01 NOTE — Lactation Note (Signed)
This note was copied from a baby's chart. Lactation Consultation Note Baby hasn't had a good long BF since birth. Baby will get on breast, suckle for a few minutes then pop off and sleep. Baby did hold a latch for over 20 min, then suckle some then hold latch about 10 min. LC massaged breast to express BM into baby to stimulate to BF. Mom worried that baby isn't BF well, requesting formula. Mom pumped w/colostrum noted 3ml.  In football hold assisted in latch teaching mom as much as possible to be independent. Mom has short shaft nipples, compressible, breast filling. Hand expressed 30 ml colostrum. Shells given to mom to wear in bra in am to evert nipples more for deeper latch.  Baby very sleepy at the breast, attempted to do suck training w/gloved finger. Would occasionally suckle well. When cried noted tongue curled and posterior frenulum. Has wide space at top gum of upper labial frenulum. MGM has wide space front teeth.  Mom states she is going to be breast/bottle at home. Has DEBP, will pump colostrum and bottle feed BM when not BF. W/slow flow nipple attempted to bottle feed baby w/suck training. Took a while for baby to suckle, would bite. Would suck then rest, needed more stimulation and suckle well then stopped. Burped baby. The unable to get baby to suckle again. Has uncoordinated suck/swallow coordination. Baby took 15 ml BM w/alot of stimulation.  Discussed w/mom breast filling, management, engorgement prevention, pumping. Plans is to BF baby every 2-3 hours, supplement w/BM, and pump after every feeding d/t breast filling. Baby needs supplemented d/t under 6 lbs and poor feeder at breast.  Discussed newborn behavior and feeding habits. Discussed cluster feeding.  Patient Name: Alison Schultz NWGNF'AToday's Date: 04/01/2016 Reason for consult: Follow-up assessment;Difficult latch;Infant < 6lbs   Maternal Data    Feeding Feeding Type: Breast Milk Nipple Type: Slow - flow Length of feed: 5  min  LATCH Score/Interventions Latch: Repeated attempts needed to sustain latch, nipple held in mouth throughout feeding, stimulation needed to elicit sucking reflex. Intervention(s): Skin to skin;Teach feeding cues;Waking techniques Intervention(s): Adjust position;Assist with latch;Breast massage;Breast compression  Audible Swallowing: Spontaneous and intermittent Intervention(s): Skin to skin;Hand expression Intervention(s): Hand expression;Alternate breast massage  Type of Nipple: Everted at rest and after stimulation  Comfort (Breast/Nipple): Filling, red/small blisters or bruises, mild/mod discomfort  Problem noted: Filling;Mild/Moderate discomfort Interventions (Filling): Massage;Frequent nursing;Double electric pump Interventions (Mild/moderate discomfort): Hand expression;Post-pump  Hold (Positioning): Full assist, staff holds infant at breast Intervention(s): Breastfeeding basics reviewed;Support Pillows;Position options;Skin to skin  LATCH Score: 6  Lactation Tools Discussed/Used Tools: Shells;Pump Shell Type: Inverted Breast pump type: Double-Electric Breast Pump Pump Review: Setup, frequency, and cleaning;Milk Storage   Consult Status Consult Status: Follow-up Date: 04/01/16 Follow-up type: In-patient    Charyl DancerCARVER, Nasim Habeeb G 04/01/2016, 2:43 AM

## 2016-04-01 NOTE — Lactation Note (Signed)
This note was copied from a baby's chart. Lactation Consultation Note No longer wants to BF. Wants to bottle feed formula. Has lots of colostrum but no longer wants to pump and give colostrum. Discussed engorgement on last visit. LC hopes mom will change her mind, especially when breast start filling and will want relief.mom knows the benefits of BF.  Patient Name: Boy Beryle LatheRaven Alioto ZOXWR'UToday's Date: 04/01/2016 Reason for consult: Follow-up assessment;Difficult latch;Infant < 6lbs   Maternal Data    Feeding Feeding Type: Breast Milk Nipple Type: Slow - flow Length of feed: 5 min  LATCH Score/Interventions Latch: Repeated attempts needed to sustain latch, nipple held in mouth throughout feeding, stimulation needed to elicit sucking reflex. Intervention(s): Skin to skin;Teach feeding cues;Waking techniques Intervention(s): Adjust position;Assist with latch;Breast massage;Breast compression  Audible Swallowing: Spontaneous and intermittent Intervention(s): Skin to skin;Hand expression Intervention(s): Hand expression;Alternate breast massage  Type of Nipple: Everted at rest and after stimulation  Comfort (Breast/Nipple): Filling, red/small blisters or bruises, mild/mod discomfort  Problem noted: Filling;Mild/Moderate discomfort Interventions (Filling): Massage;Frequent nursing;Double electric pump Interventions (Mild/moderate discomfort): Hand expression;Post-pump  Hold (Positioning): Full assist, staff holds infant at breast Intervention(s): Breastfeeding basics reviewed;Support Pillows;Position options;Skin to skin  LATCH Score: 6  Lactation Tools Discussed/Used Tools: Shells;Pump Shell Type: Inverted Breast pump type: Double-Electric Breast Pump Pump Review: Setup, frequency, and cleaning;Milk Storage   Consult Status Consult Status: Follow-up Date: 04/01/16 Follow-up type: In-patient    Charyl DancerCARVER, Fuller Makin G 04/01/2016, 5:58 AM

## 2016-04-01 NOTE — Progress Notes (Signed)
Post Partum Day #2           Information for the patient's newborn:  Prom, Boy Candiace [[id number]]  female  circumcision completed Baby name: [name] Feeding: breast and bottle  Subjective: No HA, SOB, CP, F/C, breast symptoms. Pain minimal. Normal vaginal bleeding, no clots.      Objective:  VS:  Vitals:   03/31/16 1627 03/31/16 1800 03/31/16 1942 04/01/16 0559  BP: 134/74 135/77  132/68  Pulse: 88 82  87  Resp: 18 18  18   Temp: 98.5 F (36.9 C) 98.3 F (36.8 C) 98.5 F (36.9 C) 98.4 F (36.9 C)  TempSrc: Oral Oral Oral Oral  SpO2: 100%       No intake or output data in the 24 hours ending 04/01/16 0856     Recent Labs  03/30/16 0903 03/31/16 0533  WBC 10.8* 16.5*  HGB 11.2* 8.8*  HCT 33.6* 27.0*  PLT 249 186    Blood type: --/--/AB NEG (01/10 0533) Rubella: Immune (06/26 0000)    Physical Exam:  General: alert, cooperative and no distress Uterine Fundus: firm Lochia: appropriate Perineum: intact, edema minimal DVT Evaluation: No cords or calf tenderness. No significant calf/ankle edema.    Assessment/Plan: PPD # 2 / 26 y.o., G1P1001 S/P:vacuum extraction   Principal Problem:   Postpartum care following vaginal delivery (1/9) Active Problems:   GBS (group B streptococcus) UTI complicating pregnancy   Labor and delivery indication for care or intervention   Rh negative, maternal   Rubella non-immune status, antepartum   Acute blood loss anemia RhoGAM and MMR prior to DC Oral Fe and Mag ox x 4-6 wks PP   normal postpartum exam  Continue current postpartum care  D/C home   LOS: 2 days   Juliene Pina, CNM, MSN 04/01/2016, 8:56 AM

## 2016-07-09 ENCOUNTER — Encounter (HOSPITAL_BASED_OUTPATIENT_CLINIC_OR_DEPARTMENT_OTHER): Payer: Self-pay | Admitting: Emergency Medicine

## 2016-07-09 ENCOUNTER — Emergency Department (HOSPITAL_BASED_OUTPATIENT_CLINIC_OR_DEPARTMENT_OTHER)
Admission: EM | Admit: 2016-07-09 | Discharge: 2016-07-09 | Disposition: A | Discharge status: Home / Self Care | Payer: BLUE CROSS/BLUE SHIELD | Attending: Emergency Medicine | Admitting: Emergency Medicine

## 2016-07-09 DIAGNOSIS — Z79899 Other long term (current) drug therapy: Secondary | ICD-10-CM | POA: Insufficient documentation

## 2016-07-09 DIAGNOSIS — Z791 Long term (current) use of non-steroidal anti-inflammatories (NSAID): Secondary | ICD-10-CM | POA: Diagnosis not present

## 2016-07-09 DIAGNOSIS — R197 Diarrhea, unspecified: Secondary | ICD-10-CM | POA: Diagnosis present

## 2016-07-09 DIAGNOSIS — R630 Anorexia: Secondary | ICD-10-CM | POA: Insufficient documentation

## 2016-07-09 DIAGNOSIS — R109 Unspecified abdominal pain: Secondary | ICD-10-CM | POA: Diagnosis not present

## 2016-07-09 LAB — URINALYSIS, ROUTINE W REFLEX MICROSCOPIC
BILIRUBIN URINE: NEGATIVE
GLUCOSE, UA: NEGATIVE mg/dL
Hgb urine dipstick: NEGATIVE
KETONES UR: NEGATIVE mg/dL
Leukocytes, UA: NEGATIVE
Nitrite: NEGATIVE
PH: 6 (ref 5.0–8.0)
Protein, ur: NEGATIVE mg/dL
Specific Gravity, Urine: 1.018 (ref 1.005–1.030)

## 2016-07-09 LAB — PREGNANCY, URINE: Preg Test, Ur: NEGATIVE

## 2016-07-09 MED ORDER — LOPERAMIDE HCL 2 MG PO CAPS: 2.0000 mg | ORAL_CAPSULE | Freq: Four times a day (QID) | ORAL | 0 refills | Status: DC | PRN

## 2016-07-09 NOTE — ED Provider Notes (Signed)
[facility]   CSN: [id number] Arrival date & time: 07/09/16  7425     History   Chief Complaint Chief Complaint  Patient presents with  . Diarrhea    HPI Alison Schultz is a 26 y.o. female.  Patient presents with history of loose, watery stools that began yesterday. Unsure of number of total stools but states she went "4 times just last night alone." She reports associated lower abdominal cramping during bowel movements but otherwise no pain. She has not been on antibiotics recently. Has not tried anything to help with symptoms. Denies urinary complaints, vaginal complaints, nausea, vomiting, sick contacts, suspicious food ingestions prior to symptoms, fever, lightheadedness or syncope.      Past Medical History:  Diagnosis Date  . Acute blood loss anemia 03/31/2016  . Medical history non-contributory   . Postpartum care following vaginal delivery (1/10) 03/31/2016  . Postpartum care following vaginal delivery (1/9) 03/31/2016  . Status post vacuum-assisted vaginal delivery 04/01/2016    Patient Active Problem List   Diagnosis Date Noted  . Status post vacuum-assisted vaginal delivery 04/01/2016  . Postpartum care following vaginal delivery (1/9) 03/31/2016  . Acute blood loss anemia 03/31/2016  . Labor and delivery indication for care or intervention 03/30/2016  . Rh negative, maternal 03/30/2016  . Rubella non-immune status, antepartum 03/30/2016  . GBS (group B streptococcus) UTI complicating pregnancy 02/27/2016    Past Surgical History:  Procedure Laterality Date  . NO PAST SURGERIES      OB History    Gravida Para Term Preterm AB Living   SAB TAB Ectopic Multiple Live Births         0 1       Home Medications    Prior to Admission medications   Medication Sig Start Date End Date Taking? Authorizing Provider  acetaminophen (TYLENOL) 325 MG tablet Take 2 tablets (650 mg total) by mouth every 4 (four) hours as needed  (for pain scale < 4). 04/01/16   Neta Mends, CNM  benzocaine-Menthol (DERMOPLAST) 20-0.5 % AERO Apply 1 application topically as needed for irritation (perineal discomfort). 04/01/16   Neta Mends, CNM  coconut oil OIL Apply 1 application topically as needed. 04/01/16   Neta Mends, CNM  ferrous sulfate 325 (65 FE) MG tablet Take 1 tablet (325 mg total) by mouth 2 (two) times daily with a meal. 04/01/16   Neta Mends, CNM  ibuprofen (ADVIL,MOTRIN) 600 MG tablet Take 1 tablet (600 mg total) by mouth every 6 (six) hours. 04/01/16   Neta Mends, CNM  loperamide (IMODIUM) 2 MG capsule Take 1 capsule (2 mg total) by mouth 4 (four) times daily as needed for diarrhea or loose stools. 07/09/16   Brian Kocourek, PA-C  magnesium oxide (MAG-OX) 400 (241.3 Mg) MG tablet Take 1 tablet (400 mg total) by mouth daily. 04/01/16   Neta Mends, CNM  Prenatal Vit-Fe Fumarate-FA (PRENATAL MULTIVITAMIN) TABS tablet Take 1 tablet by mouth daily at 12 noon. 04/01/16   Neta Mends, CNM    Family History History reviewed. No pertinent family history.  Social History Social History  Substance Use Topics  . Smoking status: Never Smoker  . Smokeless tobacco: Never Used  . Alcohol use No     Allergies   Patient has no known allergies.   Review of Systems Review of Systems  Constitutional: Positive for appetite change. Negative for chills and  fever.  HENT: Negative for ear pain, rhinorrhea, sneezing and sore throat.   Eyes: Negative for photophobia and visual disturbance.  Respiratory: Negative for cough, chest tightness, shortness of breath and wheezing.   Cardiovascular: Negative for chest pain and palpitations.  Gastrointestinal: Positive for abdominal pain and diarrhea. Negative for blood in stool, constipation, nausea and vomiting.  Genitourinary: Negative for dysuria, flank pain, hematuria, urgency, vaginal bleeding, vaginal discharge and vaginal pain.  Musculoskeletal: Negative for myalgias.    Skin: Negative for rash.  Neurological: Negative for dizziness, weakness, light-headedness and headaches.     Physical Exam Updated Vital Signs BP 133/85 (BP Location: Right Arm)   Pulse 78   Temp 98.4 F (36.9 C) (Oral)   Resp 18   Ht  (1.549 m)   Wt 68 kg   LMP 06/27/2016   SpO2 100%   BMI 28.34 kg/m   Physical Exam  Constitutional: She appears well-developed and well-nourished. No distress.  HENT:  Head: Normocephalic and atraumatic.  Nose: Nose normal.  Eyes: Conjunctivae and EOM are normal. Left eye exhibits no discharge. No scleral icterus.  Neck: Normal range of motion. Neck supple.  Cardiovascular: Normal rate, regular rhythm, normal heart sounds and intact distal pulses.  Exam reveals no gallop and no friction rub.   No murmur heard. Pulmonary/Chest: Effort normal and breath sounds normal. No respiratory distress.  Abdominal: Soft. Bowel sounds are normal. She exhibits no distension. There is no tenderness. There is no guarding.  No abdominal tenderness at this time as patient states she only experiences cramping during BMs.  Musculoskeletal: Normal range of motion. She exhibits no edema.  Neurological: She is alert. She exhibits normal muscle tone. Coordination normal.  Skin: Skin is warm and dry. No rash noted.  Psychiatric: She has a normal mood and affect.  Nursing note and vitals reviewed.    ED Treatments / Results  Labs (all labs ordered are listed, but only abnormal results are displayed) Labs Reviewed  URINALYSIS, ROUTINE W REFLEX MICROSCOPIC - Abnormal; Notable for the following:       Result Value   APPearance CLOUDY (*)    All other components within normal limits  PREGNANCY, URINE    EKG  EKG Interpretation None       Radiology No results found.  Procedures Procedures (including critical care time)  Medications Ordered in ED Medications - No data to display   Initial Impression / Assessment and Plan / ED Course  I have  reviewed the triage vital signs and the nursing notes.  Pertinent labs & imaging results that were available during my care of the patient were reviewed by me and considered in my medical decision making (see chart for details).     Patient's history and symptoms concerning for gastroenteritis vs. Colitis vs. Appendicitis vs. UTI vs. Pregnancy. Patient does not have any fever, bloody diarrhea or mucus in stool. She states she is maintaining adequate hydration and PO intake. No RLQ or LLQ pain and states that the cramping is only when she is experiencing a BM. This is not consistent with colitis or appendicitis. Patient is afebrile and does not appear uncomfortable or in distress in ED. No evidence of UTI or dehydration on UA. Upreg negative. Symptoms likely due to gastroenteritis due to food ingestion. Would not usually treat diarrhea as we would prefer to have it run its course. However, since she is experiencing large volumes of stools, will give Imodium. Patient educated on getting Imodium filled if  she feels like she needs it. Educated on risk of constipation so may want to take small dosage and only if needed. Encouraged to take OTC probiotics for balance of normal flora. Return precautions given.  Final Clinical Impressions(s) / ED Diagnoses   Final diagnoses:  None    New Prescriptions New Prescriptions   LOPERAMIDE (IMODIUM) 2 MG CAPSULE    Take 1 capsule (2 mg total) by mouth 4 (four) times daily as needed for diarrhea or loose stools.        Dietrich Pates, PA-C 07/10/16 1234    Lavera Guise, MD 07/11/16 [phone]

## 2016-07-09 NOTE — ED Triage Notes (Signed)
Patient states that she started to have some frequent loose stool yesterday morning. Reports that Normal eating and drinking. Denies any N/V or pain

## 2016-07-09 NOTE — Discharge Instructions (Signed)
Begin taking imodium if needed for increased loose stools. Begin taking probiotics that are found over the counter. Return to ED for worsening pain, increased diarrhea, blood in stool, vomiting, lightheadedness, or syncope.

## 2016-11-07 ENCOUNTER — Encounter (HOSPITAL_BASED_OUTPATIENT_CLINIC_OR_DEPARTMENT_OTHER): Payer: Self-pay | Admitting: *Deleted

## 2016-11-07 DIAGNOSIS — Z79899 Other long term (current) drug therapy: Secondary | ICD-10-CM | POA: Diagnosis not present

## 2016-11-07 DIAGNOSIS — H60392 Other infective otitis externa, left ear: Secondary | ICD-10-CM | POA: Insufficient documentation

## 2016-11-07 DIAGNOSIS — J029 Acute pharyngitis, unspecified: Secondary | ICD-10-CM | POA: Diagnosis present

## 2016-11-07 NOTE — ED Triage Notes (Signed)
Pt c/o sore throat that started yesterday. Also c/o left ear pain that started started yesterday as well. States she also had drainage from her left ear. States she is able to hear out of her left ear. Denies any fevers. C/o dry cough. Has not taken any medications.

## 2016-11-08 ENCOUNTER — Emergency Department (HOSPITAL_BASED_OUTPATIENT_CLINIC_OR_DEPARTMENT_OTHER)
Admission: EM | Admit: 2016-11-08 | Discharge: 2016-11-08 | Disposition: A | Discharge status: Home / Self Care | Payer: No Typology Code available for payment source | Attending: Emergency Medicine | Admitting: Emergency Medicine

## 2016-11-08 DIAGNOSIS — H60392 Other infective otitis externa, left ear: Secondary | ICD-10-CM

## 2016-11-08 MED ORDER — FEXOFENADINE-PSEUDOEPHED ER 60-120 MG PO TB12: 1.0000 | ORAL_TABLET | Freq: Two times a day (BID) | ORAL | 0 refills | Status: AC

## 2016-11-08 MED ORDER — NEOMYCIN-COLIST-HC-THONZONIUM 3.3-3-10-0.5 MG/ML OT SUSP
4.0000 [drp] | OTIC | Status: DC
  Administered 2016-11-08: 4 [drp] via OTIC
  Filled 2016-11-08: qty 5

## 2016-11-08 NOTE — ED Notes (Signed)
Alert, NAD, calm, interactive, resps e/u, speaking in clear complete sentences, no dyspnea noted, skin W&D, c/o L ear pain and sore throat on L side, (denies: fever, sob, nausea, dizziness or visual changes). Family at [facility].

## 2016-11-08 NOTE — ED Provider Notes (Signed)
[facility] Provider Note   CSN: [id number] Arrival date & time: 11/07/16  2255  By signing my name below, I, Diona Browner, attest that this documentation has been prepared under the direction and in the presence of Cris Gibby, Canary Brim, MD. Electronically Signed: Diona Browner, ED Scribe. 11/08/16. 12:33 AM.  History   Chief Complaint Chief Complaint  Patient presents with  . Sore Throat    HPI Alison Schultz is a 26 y.o. female who presents to the Emergency Department complaining of sore throat that started yesterday. Associated sx include left ear pain that started yesterday, ear drainage that started today, and dry cough. Pt hasn't tried any at home treatments to alleviate her sx. Pt denies fever, nausea, vomiting, or any other complaints at this time.   The history is provided by the patient. No language interpreter was used.    Past Medical History:  Diagnosis Date  . Acute blood loss anemia 03/31/2016  . Medical history non-contributory   . Postpartum care following vaginal delivery (1/10) 03/31/2016  . Postpartum care following vaginal delivery (1/9) 03/31/2016  . Status post vacuum-assisted vaginal delivery 04/01/2016    Patient Active Problem List   Diagnosis Date Noted  . Status post vacuum-assisted vaginal delivery 04/01/2016  . Postpartum care following vaginal delivery (1/9) 03/31/2016  . Acute blood loss anemia 03/31/2016  . Labor and delivery indication for care or intervention 03/30/2016  . Rh negative, maternal 03/30/2016  . Rubella non-immune status, antepartum 03/30/2016  . GBS (group B streptococcus) UTI complicating pregnancy 02/27/2016    Past Surgical History:  Procedure Laterality Date  . NO PAST SURGERIES      OB History    Gravida Para Term Preterm AB Living   1 1 1     1    SAB TAB Ectopic Multiple Live Births         0 1       Home Medications    Prior to Admission medications   Medication Sig Start Date End Date  Taking? Authorizing Provider  norethindrone-ethinyl estradiol 1/35 (ORTHO-NOVUM, NORTREL,CYCLAFEM) tablet Take 1 tablet by mouth daily.   Yes [provider]  acetaminophen (TYLENOL) 325 MG tablet Take 2 tablets (650 mg total) by mouth every 4 (four) hours as needed (for pain scale < 4). 04/01/16   Neta Mends, CNM  benzocaine-Menthol (DERMOPLAST) 20-0.5 % AERO Apply 1 application topically as needed for irritation (perineal discomfort). 04/01/16   Neta Mends, CNM  coconut oil OIL Apply 1 application topically as needed. 04/01/16   Neta Mends, CNM  ferrous sulfate 325 (65 FE) MG tablet Take 1 tablet (325 mg total) by mouth 2 (two) times daily with a meal. 04/01/16   Neta Mends, CNM  fexofenadine-pseudoephedrine (ALLEGRA-D) 60-120 MG 12 hr tablet Take 1 tablet by mouth every 12 (twelve) hours. 11/08/16   Gilda Crease, MD  ibuprofen (ADVIL,MOTRIN) 600 MG tablet Take 1 tablet (600 mg total) by mouth every 6 (six) hours. 04/01/16   Neta Mends, CNM  loperamide (IMODIUM) 2 MG capsule Take 1 capsule (2 mg total) by mouth 4 (four) times daily as needed for diarrhea or loose stools. 07/09/16   Khatri, Hina, PA-C  magnesium oxide (MAG-OX) 400 (241.3 Mg) MG tablet Take 1 tablet (400 mg total) by mouth daily. 04/01/16   Neta Mends, CNM  Prenatal Vit-Fe Fumarate-FA (PRENATAL MULTIVITAMIN) TABS tablet Take 1 tablet by mouth daily at 12 noon. 04/01/16   Neta Mends,  CNM    Family History No family history on file.  Social History Social History  Substance Use Topics  . Smoking status: Never Smoker  . Smokeless tobacco: Never Used  . Alcohol use No     Allergies   Patient has no known allergies.   Review of Systems Review of Systems  Constitutional: Negative for fever.  HENT: Positive for ear discharge, ear pain and sore throat.   Respiratory: Positive for cough.   Gastrointestinal: Negative for nausea and vomiting.  All other systems reviewed and are  negative.    Physical Exam Updated Vital Signs BP 134/84 (BP Location: Left Arm)   Pulse 84   Temp 98.5 F (36.9 C) (Oral)   Resp 18   Ht 5\' 1"  (1.549 m)   Wt 63.5 kg (140 lb)   LMP 09/07/2016   SpO2 100%   BMI 26.45 kg/m   Physical Exam  Constitutional: She is oriented to person, place, and time. She appears well-developed and well-nourished. No distress.  HENT:  Head: Normocephalic and atraumatic.  Right Ear: Hearing and tympanic membrane normal.  Left Ear: Hearing normal. There is tenderness (tragus).  Nose: Nose normal.  Mouth/Throat: Oropharynx is clear and moist and mucous membranes are normal.  Left external canal erythematous and swollen.  Eyes: Pupils are equal, round, and reactive to light. Conjunctivae and EOM are normal.  Neck: Normal range of motion. Neck supple.  Cardiovascular: Regular rhythm, S1 normal and S2 normal.  Exam reveals no gallop and no friction rub.   No murmur heard. Pulmonary/Chest: Effort normal and breath sounds normal. No respiratory distress. She exhibits no tenderness.  Abdominal: Soft. Normal appearance and bowel sounds are normal. There is no hepatosplenomegaly. There is no tenderness. There is no rebound, no guarding, no tenderness at McBurney's point and negative Murphy's sign. No hernia.  Musculoskeletal: Normal range of motion.  Neurological: She is alert and oriented to person, place, and time. She has normal strength. No cranial nerve deficit or sensory deficit. Coordination normal. GCS eye subscore is 4. GCS verbal subscore is 5. GCS motor subscore is 6.  Skin: Skin is warm, dry and intact. No rash noted. No cyanosis.  Psychiatric: She has a normal mood and affect. Her speech is normal and behavior is normal. Thought content normal.  Nursing note and vitals reviewed.    ED Treatments / Results  DIAGNOSTIC STUDIES: Oxygen Saturation is 100% on RA, normal by my interpretation.   COORDINATION OF CARE: 12:33 AM-Discussed next  steps with pt. Pt verbalized understanding and is agreeable with the plan.   Labs (all labs ordered are listed, but only abnormal results are displayed) Labs Reviewed - No data to display  EKG  EKG Interpretation None       Radiology No results found.  Procedures Procedures (including critical care time)  Medications Ordered in ED Medications  neomycin-colistin-hydrocortisone-thonzonium (CORTISPORIN TC) OTIC (EAR) suspension 4 drop (4 drops Left EAR Given 11/08/16 0053)     Initial Impression / Assessment and Plan / ED Course  I have reviewed the triage vital signs and the nursing notes.  Pertinent labs & imaging results that were available during my care of the patient were reviewed by me and considered in my medical decision making (see chart for details).     Presents to the ER for evaluation of sore throat and left ear pain. Patient reports throat hurts predominantly on the left side. Oropharyngeal examination is normal, no tonsillar enlargement or exudate. No concern  for abscess. Patient has significant tenderness to palpation of the external ear with swelling of the ear canal but no drainage. Tympanic membranes normal.  Final Clinical Impressions(s) / ED Diagnoses   Final diagnoses:  Other infective acute otitis externa of left ear    New Prescriptions New Prescriptions   FEXOFENADINE-PSEUDOEPHEDRINE (ALLEGRA-D) 60-120 MG 12 HR TABLET    Take 1 tablet by mouth every 12 (twelve) hours.   I personally performed the services described in this documentation, which was scribed in my presence. The recorded information has been reviewed and is accurate.     Gilda Crease, MD 11/08/16 [phone]

## 2016-11-08 NOTE — ED Notes (Addendum)
EDP into room, prior to RN assessment, see MD notes, pending orders.   

## 2017-04-02 IMAGING — US US OB COMP LESS 14 WK
1 series · 15 of 23 positions shown · non-contrast
Comparison: None.

CLINICAL DATA: Vaginal bleeding since midnight. Gravida 1 para 0.
Uncertain LMP.

EXAM:
OBSTETRIC <14 WK ULTRASOUND
TECHNIQUE: Transabdominal ultrasound was performed for evaluation of the
gestation as well as the maternal uterus and adnexal regions.

[Series 1: us ob comp less 14 wk · 15 of 23 slices shown]
[im 1/23]
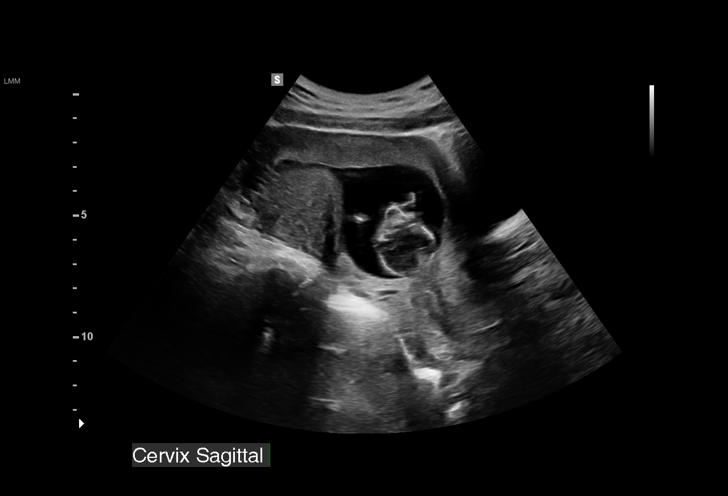
[im 3/23]
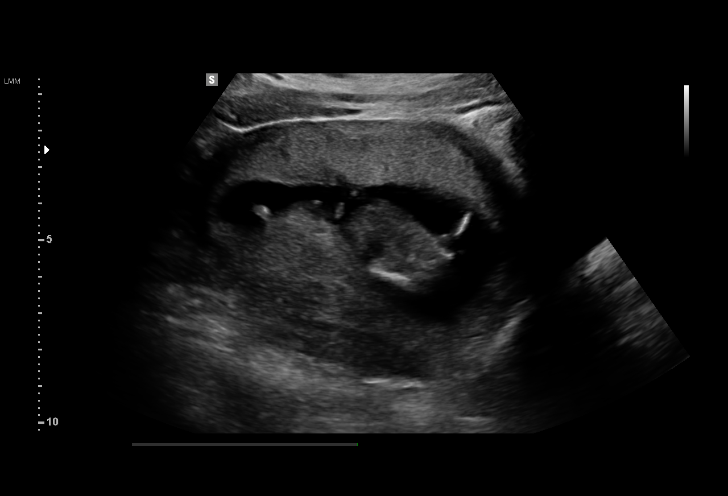
[im 4/23]
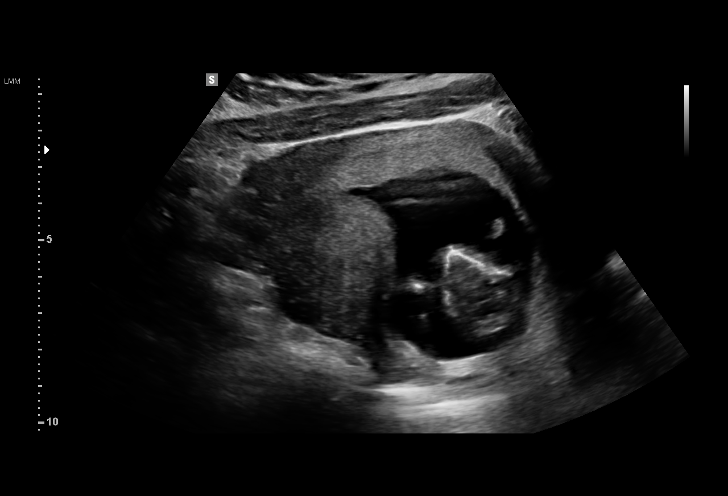
[im 6/23]
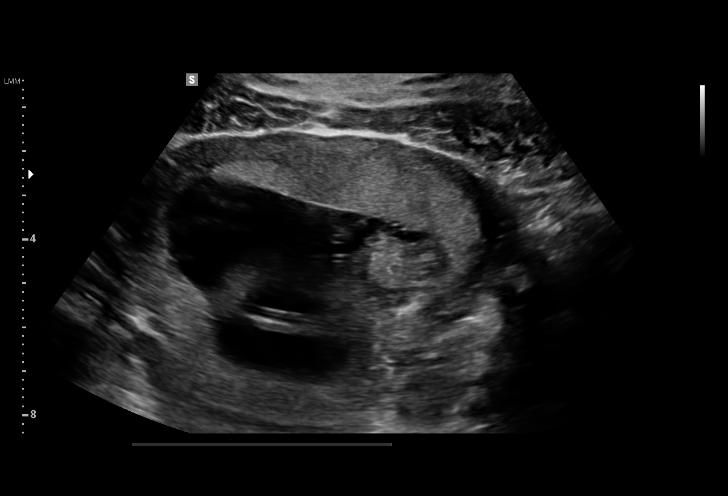
[im 7/23]
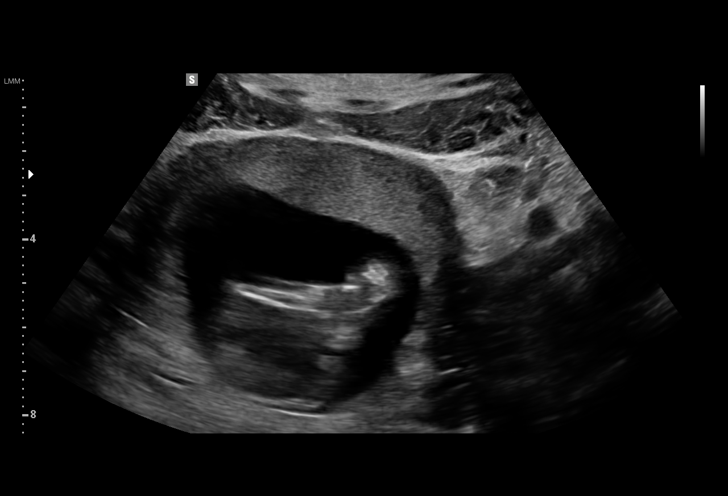
[im 9/23]
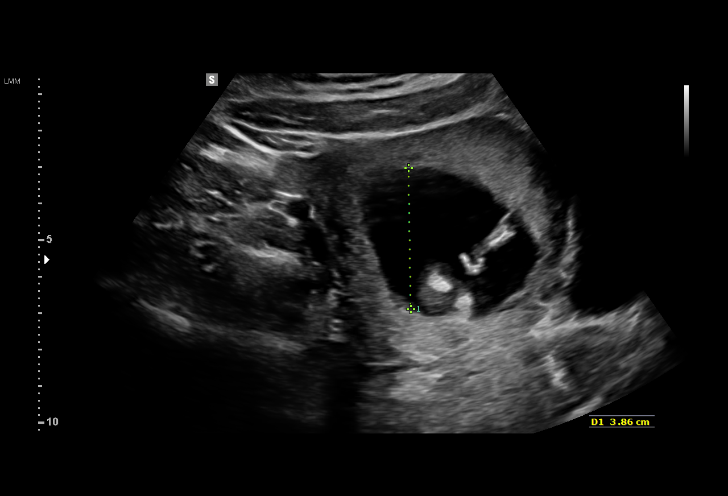
[im 10/23]
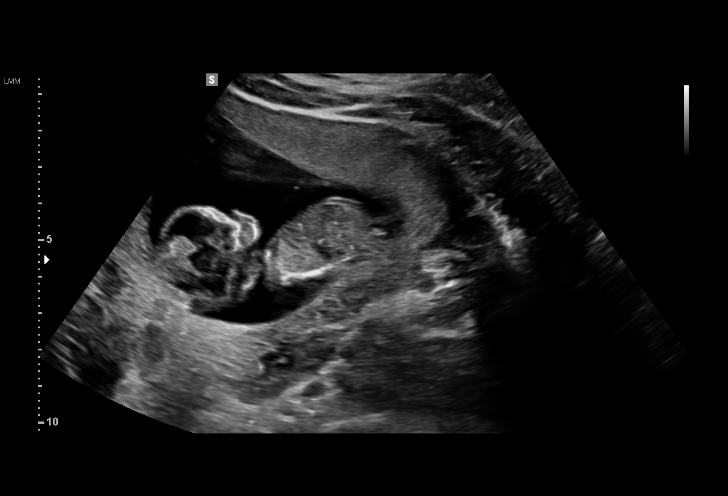
[im 12/23]
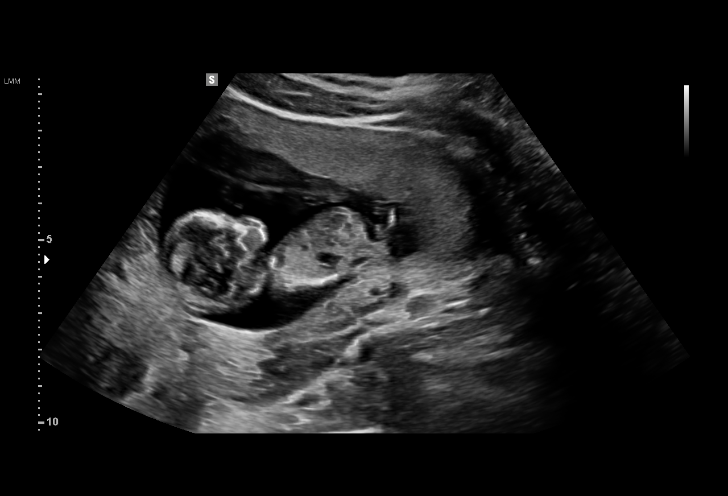
[im 14/23]
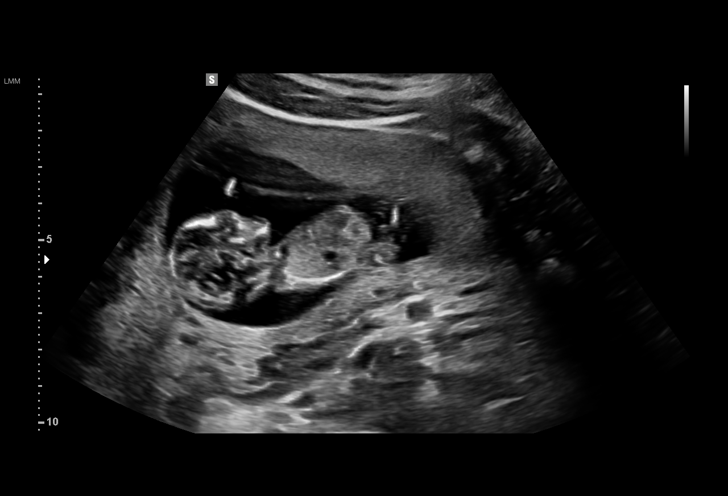
[im 15/23]
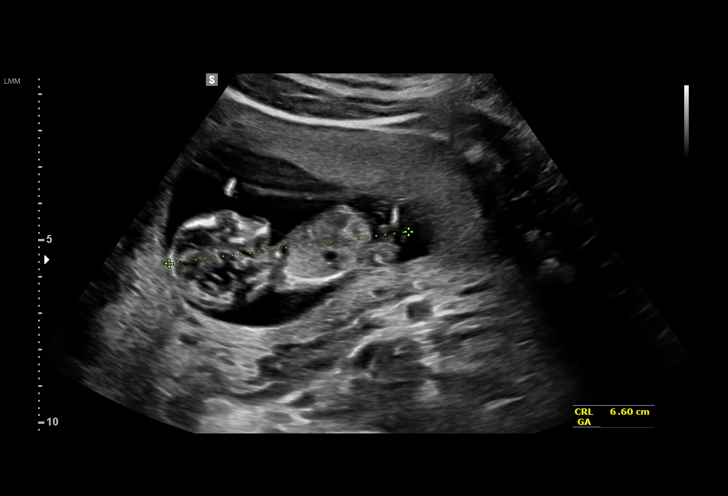
[im 17/23]
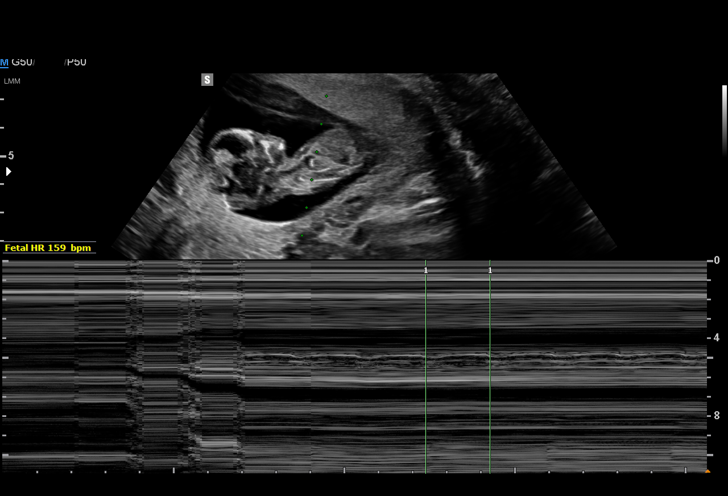
[im 18/23]
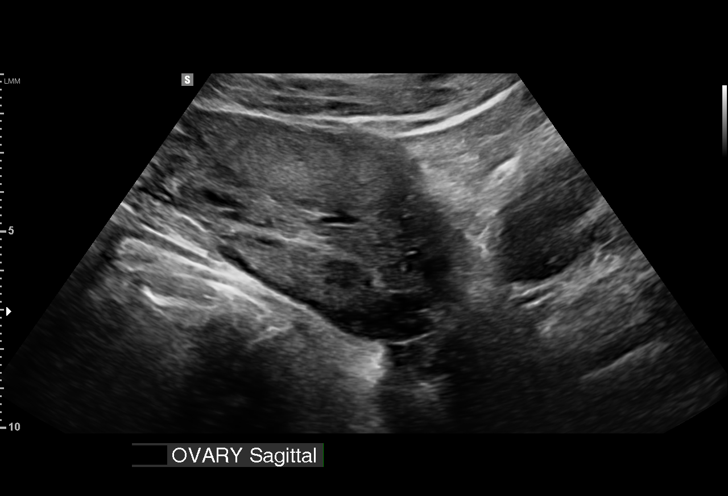
[im 20/23]
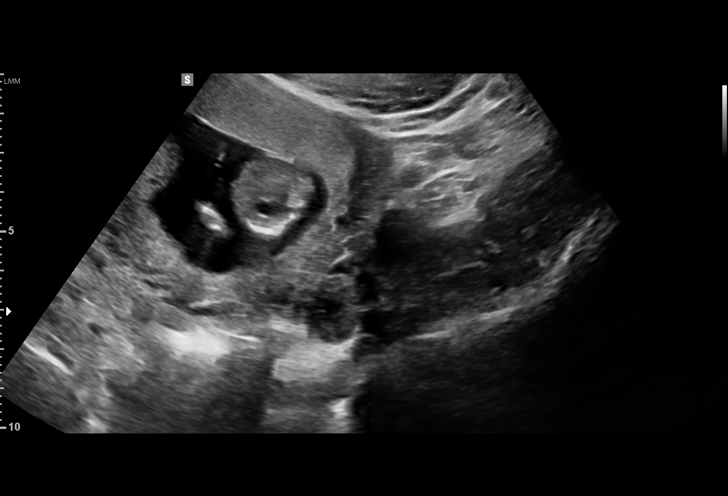
[im 21/23]
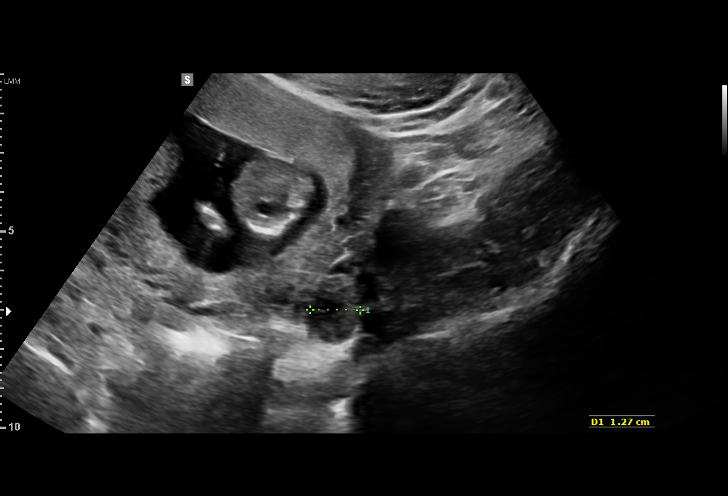
[im 23/23]
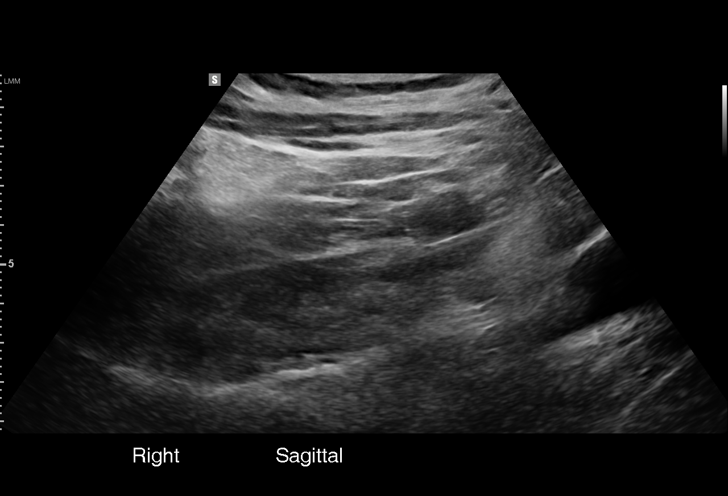

[15 of 23 positions shown; findings below may reference images not displayed]

FINDINGS: Intrauterine gestational sac: Present

Yolk sac:  Not seen

Embryo:  Present

Cardiac Activity: Present

Heart Rate: 159 bpm

CRL:   67  mm   13 w 0 d                  US EDC: 04/10/2016

Subchorionic hemorrhage:  None visualized.

Maternal uterus/adnexae: Right ovary is not visualized. Left ovary
has a normal appearance. No free pelvic fluid.
IMPRESSION: 1. Single living intrauterine embryo.
2. Size by ultrasound correlates with EDC of 04/10/2016.

## 2017-04-15 ENCOUNTER — Other Ambulatory Visit: Payer: Self-pay

## 2017-04-15 ENCOUNTER — Encounter (HOSPITAL_BASED_OUTPATIENT_CLINIC_OR_DEPARTMENT_OTHER): Payer: Self-pay

## 2017-04-15 ENCOUNTER — Emergency Department (HOSPITAL_BASED_OUTPATIENT_CLINIC_OR_DEPARTMENT_OTHER)
Admission: EM | Admit: 2017-04-15 | Discharge: 2017-04-15 | Disposition: A | Discharge status: Home / Self Care | Payer: PRIVATE HEALTH INSURANCE | Attending: Emergency Medicine | Admitting: Emergency Medicine

## 2017-04-15 DIAGNOSIS — R1030 Lower abdominal pain, unspecified: Secondary | ICD-10-CM | POA: Diagnosis not present

## 2017-04-15 DIAGNOSIS — R197 Diarrhea, unspecified: Secondary | ICD-10-CM

## 2017-04-15 DIAGNOSIS — Z79899 Other long term (current) drug therapy: Secondary | ICD-10-CM | POA: Diagnosis not present

## 2017-04-15 MED ORDER — LOPERAMIDE HCL 2 MG PO CAPS: 2.0000 mg | ORAL_CAPSULE | Freq: Four times a day (QID) | ORAL | 0 refills | Status: AC | PRN

## 2017-04-15 NOTE — ED Provider Notes (Signed)
[facility] EMERGENCY DEPARTMENT Provider Note   CSN: [id number] Arrival date & time: 04/15/17  4098     History   Chief Complaint Chief Complaint  Patient presents with  . Diarrhea    HPI Alison Schultz is a 27 y.o. female.  HPI  Diarrhea and abdominal pain for one week, tightening feeling in lower abdomen Diarrhea 3 times today, 3-5 times per day other times No nausea or vomiting No urinary symptoms No fevers Eating ok No recent abx, no recent travel, no known close sick contacts, no known suspicious foods but does report diarrheal illness going around work  Past Medical History:  Diagnosis Date  . Acute blood loss anemia 03/31/2016  . Medical history non-contributory   . Postpartum care following vaginal delivery (1/10) 03/31/2016  . Postpartum care following vaginal delivery (1/9) 03/31/2016  . Status post vacuum-assisted vaginal delivery 04/01/2016    Patient Active Problem List   Diagnosis Date Noted  . Status post vacuum-assisted vaginal delivery 04/01/2016  . Postpartum care following vaginal delivery (1/9) 03/31/2016  . Acute blood loss anemia 03/31/2016  . Labor and delivery indication for care or intervention 03/30/2016  . Rh negative, maternal 03/30/2016  . Rubella non-immune status, antepartum 03/30/2016  . GBS (group B streptococcus) UTI complicating pregnancy 02/27/2016    Past Surgical History:  Procedure Laterality Date  . NO PAST SURGERIES      OB History    Gravida Para Term Preterm AB Living   1 1 1     1    SAB TAB Ectopic Multiple Live Births         0 1       Home Medications    Prior to Admission medications   Medication Sig Start Date End Date Taking? Authorizing Provider  acetaminophen (TYLENOL) 325 MG tablet Take 2 tablets (650 mg total) by mouth every 4 (four) hours as needed (for pain scale < 4). 04/01/16  Yes Arlan Organ C, CNM  benzocaine-Menthol (DERMOPLAST) 20-0.5 % AERO Apply 1 application topically as needed  for irritation (perineal discomfort). 04/01/16  Yes Arlan Organ C, CNM  coconut oil OIL Apply 1 application topically as needed. 04/01/16  Yes Neta Mends, CNM  ferrous sulfate 325 (65 FE) MG tablet Take 1 tablet (325 mg total) by mouth 2 (two) times daily with a meal. 04/01/16  Yes Arlan Organ C, CNM  fexofenadine-pseudoephedrine (ALLEGRA-D) 60-120 MG 12 hr tablet Take 1 tablet by mouth every 12 (twelve) hours. 11/08/16  Yes Pollina, Canary Brim, MD  ibuprofen (ADVIL,MOTRIN) 600 MG tablet Take 1 tablet (600 mg total) by mouth every 6 (six) hours. 04/01/16  Yes Arlan Organ C, CNM  magnesium oxide (MAG-OX) 400 (241.3 Mg) MG tablet Take 1 tablet (400 mg total) by mouth daily. 04/01/16  Yes Neta Mends, CNM  Prenatal Vit-Fe Fumarate-FA (PRENATAL MULTIVITAMIN) TABS tablet Take 1 tablet by mouth daily at 12 noon. 04/01/16  Yes Neta Mends, CNM  loperamide (IMODIUM) 2 MG capsule Take 1 capsule (2 mg total) by mouth 4 (four) times daily as needed for diarrhea or loose stools. 04/15/17   Alvira Monday, MD  norethindrone-ethinyl estradiol 1/35 (ORTHO-NOVUM, NORTREL,CYCLAFEM) tablet Take 1 tablet by mouth daily.    [provider]    Family History No family history on file.  Social History Social History   Tobacco Use  . Smoking status: Never Smoker  . Smokeless tobacco: Never Used  Substance Use Topics  . Alcohol use: No  .  Drug use: No     Allergies   Patient has no known allergies.   Review of Systems Review of Systems  Constitutional: Negative for fever.  HENT: Negative for sore throat.   Eyes: Negative for visual disturbance.  Respiratory: Negative for cough and shortness of breath.   Cardiovascular: Negative for chest pain.  Gastrointestinal: Positive for abdominal pain and diarrhea. Negative for constipation, nausea and vomiting.  Genitourinary: Negative for difficulty urinating.  Musculoskeletal: Negative for back pain and neck pain.  Skin: Negative for  rash.  Neurological: Negative for syncope, light-headedness and headaches.     Physical Exam Updated Vital Signs BP 139/79 (BP Location: Left Arm)   Pulse 73   Temp 97.9 F (36.6 C) (Oral)   Resp 16   Ht 5\' 1"  (1.549 m)   Wt 63.5 kg (140 lb)   LMP 04/12/2017   SpO2 100%   BMI 26.45 kg/m   Physical Exam  Constitutional: She is oriented to person, place, and time. She appears well-developed and well-nourished. No distress.  HENT:  Head: Normocephalic and atraumatic.  Eyes: Conjunctivae and EOM are normal.  Neck: Normal range of motion.  Cardiovascular: Normal rate, regular rhythm, normal heart sounds and intact distal pulses. Exam reveals no gallop and no friction rub.  No murmur heard. Pulmonary/Chest: Effort normal and breath sounds normal. No respiratory distress. She has no wheezes. She has no rales.  Abdominal: Soft. She exhibits no distension. There is no tenderness (mild suprapubic). There is no guarding.  Musculoskeletal: She exhibits no edema or tenderness.  Neurological: She is alert and oriented to person, place, and time.  Skin: Skin is warm and dry. No rash noted. She is not diaphoretic. No erythema.  Nursing note and vitals reviewed.    ED Treatments / Results  Labs (all labs ordered are listed, but only abnormal results are displayed) Labs Reviewed - No data to display  EKG  EKG Interpretation None       Radiology No results found.  Procedures Procedures (including critical care time)  Medications Ordered in ED Medications - No data to display   Initial Impression / Assessment and Plan / ED Course  I have reviewed the triage vital signs and the nursing notes.  Pertinent labs & imaging results that were available during my care of the patient were reviewed by me and considered in my medical decision making (see chart for details).     27yo female with no significant medical history presents with concern for diarrhea for one week.  She  appears well hydrated with normal vital signs, no vomiting, do not suspect significant alteration in labs and do not feel this will change course of care.  Abdominal exam benign, no urinary symptoms, doubt appendicitis, diverticulitis, UTI.  Suspect most likely infectious etiology of diarrhea. Pt unable to provide sample at this time. Recommend continued supportive care through weekend and if continuing diarrhea on Monday recommend calling PCP to have stool samples taken after 10 days of symptoms.  At this time, it is still likely this is viral or bacterial diarrhea which will resolve with time.  Recommend continued hydration, supportive care. Imodium reasonable as no CDiff risk factors.  Patient discharged in stable condition with understanding of reasons to return.   Final Clinical Impressions(s) / ED Diagnoses   Final diagnoses:  Diarrhea of presumed infectious origin    ED Discharge Orders        Ordered    loperamide (IMODIUM) 2 MG capsule  4 times daily PRN     04/15/17 4098       Alvira Monday, MD 04/15/17 [phone]

## 2017-04-15 NOTE — ED Triage Notes (Signed)
Pt presents with lower abdominal pain and diarrhea x 1 week, denies vomiting.

## 2017-04-30 ENCOUNTER — Other Ambulatory Visit: Payer: Self-pay

## 2017-04-30 ENCOUNTER — Emergency Department (HOSPITAL_BASED_OUTPATIENT_CLINIC_OR_DEPARTMENT_OTHER): Payer: No Typology Code available for payment source

## 2017-04-30 ENCOUNTER — Emergency Department (HOSPITAL_BASED_OUTPATIENT_CLINIC_OR_DEPARTMENT_OTHER)
Admission: EM | Admit: 2017-04-30 | Discharge: 2017-04-30 | Disposition: A | Discharge status: Home / Self Care | Payer: No Typology Code available for payment source | Attending: Emergency Medicine | Admitting: Emergency Medicine

## 2017-04-30 ENCOUNTER — Encounter (HOSPITAL_BASED_OUTPATIENT_CLINIC_OR_DEPARTMENT_OTHER): Payer: Self-pay | Admitting: *Deleted

## 2017-04-30 DIAGNOSIS — Z79899 Other long term (current) drug therapy: Secondary | ICD-10-CM | POA: Diagnosis not present

## 2017-04-30 DIAGNOSIS — R05 Cough: Secondary | ICD-10-CM | POA: Diagnosis present

## 2017-04-30 DIAGNOSIS — J069 Acute upper respiratory infection, unspecified: Secondary | ICD-10-CM | POA: Diagnosis not present

## 2017-04-30 DIAGNOSIS — B9789 Other viral agents as the cause of diseases classified elsewhere: Secondary | ICD-10-CM | POA: Diagnosis not present

## 2017-04-30 MED ORDER — ACETAMINOPHEN 500 MG PO TABS: 500.0000 mg | ORAL_TABLET | Freq: Four times a day (QID) | ORAL | 0 refills | Status: AC | PRN

## 2017-04-30 MED ORDER — IBUPROFEN 400 MG PO TABS: 400.0000 mg | ORAL_TABLET | Freq: Four times a day (QID) | ORAL | 0 refills | Status: AC | PRN

## 2017-04-30 MED ORDER — CETIRIZINE-PSEUDOEPHEDRINE ER 5-120 MG PO TB12: 1.0000 | ORAL_TABLET | Freq: Two times a day (BID) | ORAL | 0 refills | Status: AC

## 2017-04-30 MED ORDER — DM-GUAIFENESIN ER 30-600 MG PO TB12: 1.0000 | ORAL_TABLET | Freq: Two times a day (BID) | ORAL | 0 refills | Status: AC | PRN

## 2017-04-30 NOTE — Discharge Instructions (Signed)
Take medications as prescribed. Get plenty of rest and drink plenty of fluids. Please return to the emergency department if you develop any new or worsening symptoms including severe shortness of breath, passing out, severe lightheadedness, or any other concerning symptom.

## 2017-04-30 NOTE — ED Notes (Signed)
Pt given d/c instructions as per chart. Rx x 4. Verbalizes understanding. No questions. 

## 2017-04-30 NOTE — ED Provider Notes (Signed)
[facility] EMERGENCY DEPARTMENT Provider Note   CSN: [id number] Arrival date & time: 04/30/17  1320     History   Chief Complaint Chief Complaint  Patient presents with  . URI    HPI Alison Schultz is a 27 y.o. female with history of anemia who presents with a 4-day history of cough, nasal congestion, and body aches.  Patient has had associated chest pain only with coughing.  She denies shortness of breath.  She also denies any fever, abdominal pain, nausea, vomiting, urinary symptoms.  She has had decreased appetite, but is drinking fluids well.  She is taking NyQuil at home without significant relief.  HPI  Past Medical History:  Diagnosis Date  . Acute blood loss anemia 03/31/2016  . Medical history non-contributory   . Postpartum care following vaginal delivery (1/10) 03/31/2016  . Postpartum care following vaginal delivery (1/9) 03/31/2016  . Status post vacuum-assisted vaginal delivery 04/01/2016    Patient Active Problem List   Diagnosis Date Noted  . Status post vacuum-assisted vaginal delivery 04/01/2016  . Postpartum care following vaginal delivery (1/9) 03/31/2016  . Acute blood loss anemia 03/31/2016  . Labor and delivery indication for care or intervention 03/30/2016  . Rh negative, maternal 03/30/2016  . Rubella non-immune status, antepartum 03/30/2016  . GBS (group B streptococcus) UTI complicating pregnancy 02/27/2016    Past Surgical History:  Procedure Laterality Date  . NO PAST SURGERIES      OB History    Gravida Para Term Preterm AB Living   1 1 1     1    SAB TAB Ectopic Multiple Live Births         0 1       Home Medications    Prior to Admission medications   Medication Sig Start Date End Date Taking? Authorizing Provider  acetaminophen (TYLENOL) 500 MG tablet Take 1 tablet (500 mg total) by mouth every 6 (six) hours as needed. 04/30/17   Eliud Polo, Waylan BogaAlexandra M, PA-C  benzocaine-Menthol (DERMOPLAST) 20-0.5 % AERO Apply 1 application  topically as needed for irritation (perineal discomfort). 04/01/16   Neta MendsPaul, Daniela C, CNM  cetirizine-pseudoephedrine (ZYRTEC-D) 5-120 MG tablet Take 1 tablet by mouth 2 (two) times daily. 04/30/17   Jasten Guyette, Waylan BogaAlexandra M, PA-C  coconut oil OIL Apply 1 application topically as needed. 04/01/16   Neta MendsPaul, Daniela C, CNM  dextromethorphan-guaiFENesin (MUCINEX DM) 30-600 MG 12hr tablet Take 1 tablet by mouth 2 (two) times daily as needed for cough. 04/30/17   Emi HolesLaw, Emon Miggins M, PA-C  ferrous sulfate 325 (65 FE) MG tablet Take 1 tablet (325 mg total) by mouth 2 (two) times daily with a meal. 04/01/16   Neta MendsPaul, Daniela C, CNM  fexofenadine-pseudoephedrine (ALLEGRA-D) 60-120 MG 12 hr tablet Take 1 tablet by mouth every 12 (twelve) hours. 11/08/16   Gilda CreasePollina, Christopher J, MD  ibuprofen (ADVIL,MOTRIN) 400 MG tablet Take 1 tablet (400 mg total) by mouth every 6 (six) hours as needed. 04/30/17   Lyndzee Kliebert, Waylan BogaAlexandra M, PA-C  loperamide (IMODIUM) 2 MG capsule Take 1 capsule (2 mg total) by mouth 4 (four) times daily as needed for diarrhea or loose stools. 04/15/17   Alvira MondaySchlossman, Erin, MD  magnesium oxide (MAG-OX) 400 (241.3 Mg) MG tablet Take 1 tablet (400 mg total) by mouth daily. 04/01/16   Neta MendsPaul, Daniela C, CNM  norethindrone-ethinyl estradiol 1/35 (ORTHO-NOVUM, NORTREL,CYCLAFEM) tablet Take 1 tablet by mouth daily.    [provider]  Prenatal Vit-Fe Fumarate-FA (PRENATAL MULTIVITAMIN) TABS tablet Take 1  tablet by mouth daily at 12 noon. 04/01/16   Neta Mends, CNM    Family History History reviewed. No pertinent family history.  Social History Social History   Tobacco Use  . Smoking status: Never Smoker  . Smokeless tobacco: Never Used  Substance Use Topics  . Alcohol use: No  . Drug use: No     Allergies   Patient has no known allergies.   Review of Systems Review of Systems  Constitutional: Negative for fever.  HENT: Positive for congestion. Negative for sore throat.   Respiratory: Positive for cough.    Cardiovascular: Positive for chest pain (only with coughing).  Gastrointestinal: Negative for abdominal pain, nausea and vomiting.  Musculoskeletal: Positive for myalgias.     Physical Exam Updated Vital Signs BP (!) 147/96 (BP Location: Left Arm)   Pulse 84   Temp 98.2 F (36.8 C) (Oral)   Resp 18   Ht 5\' 1"  (1.549 m)   Wt 63.5 kg (140 lb)   LMP 04/12/2017   SpO2 99%   BMI 26.45 kg/m   Physical Exam  Constitutional: She appears well-developed and well-nourished. No distress.  HENT:  Head: Normocephalic and atraumatic.  Mouth/Throat: Oropharynx is clear and moist. No oropharyngeal exudate.  Eyes: Conjunctivae are normal. Pupils are equal, round, and reactive to light. Right eye exhibits no discharge. Left eye exhibits no discharge. No scleral icterus.  Neck: Normal range of motion. Neck supple. No thyromegaly present.  Cardiovascular: Normal rate, regular rhythm, normal heart sounds and intact distal pulses. Exam reveals no gallop and no friction rub.  No murmur heard. Pulmonary/Chest: Effort normal and breath sounds normal. No stridor. No respiratory distress. She has no wheezes. She has no rales. She exhibits tenderness.  Abdominal: Soft. Bowel sounds are normal. She exhibits no distension. There is no tenderness. There is no rebound and no guarding.  Musculoskeletal: She exhibits no edema.  Lymphadenopathy:    She has no cervical adenopathy.  Neurological: She is alert. Coordination normal.  Skin: Skin is warm and dry. No rash noted. She is not diaphoretic. No pallor.  Psychiatric: She has a normal mood and affect.  Nursing note and vitals reviewed.    ED Treatments / Results  Labs (all labs ordered are listed, but only abnormal results are displayed) Labs Reviewed - No data to display  EKG  EKG Interpretation None       Radiology Dg Chest 2 View  Result Date: 04/30/2017 CLINICAL DATA:  Progressive cough EXAM: CHEST  2 VIEW COMPARISON:  None. FINDINGS:  Lungs are clear. Heart size and pulmonary vascularity are normal. No adenopathy. No bone lesions. IMPRESSION: No edema or consolidation. Electronically Signed   By: Bretta Bang III M.D.   On: 04/30/2017 13:56    Procedures Procedures (including critical care time)  Medications Ordered in ED Medications - No data to display   Initial Impression / Assessment and Plan / ED Course  I have reviewed the triage vital signs and the nursing notes.  Pertinent labs & imaging results that were available during my care of the patient were reviewed by me and considered in my medical decision making (see chart for details).     Patient with probable viral upper respiratory infection.  Patient is afebrile.  Chest x-ray is negative.  Chest pain is reproducible.  I feel chest pain is related to chest wall pain from coughing.  Will treat symptomatically with Mucinex DM, Zyrtec-D, and Advil and Tylenol.  Return precautions discussed.  Patient understands and agrees with plan.  Patient vitals stable and discharged in satisfactory condition.  Final Clinical Impressions(s) / ED Diagnoses   Final diagnoses:  Viral URI with cough    ED Discharge Orders        Ordered    dextromethorphan-guaiFENesin [facility] DM) 30-600 MG 12hr tablet  2 times daily PRN     04/30/17 1540    cetirizine-pseudoephedrine (ZYRTEC-D) 5-120 MG tablet  2 times daily     04/30/17 1540    acetaminophen (TYLENOL) 500 MG tablet  Every 6 hours PRN     04/30/17 1540    ibuprofen (ADVIL,MOTRIN) 400 MG tablet  Every 6 hours PRN     04/30/17 1540       Madisyn Mawhinney, [place], PA-C 04/30/17 1710    Doug Sou, MD 05/01/17 0700

## 2017-04-30 NOTE — ED Triage Notes (Signed)
Cough, nasal congestion chills x 1-2 days.

## 2017-04-30 NOTE — ED Notes (Signed)
Pt reports cough and body aches since Wednesday. States Sx were "the worst" yesterday. Denies known fever

## 2018-10-17 ENCOUNTER — Encounter (HOSPITAL_BASED_OUTPATIENT_CLINIC_OR_DEPARTMENT_OTHER): Payer: Self-pay | Admitting: Emergency Medicine

## 2018-10-17 ENCOUNTER — Emergency Department (HOSPITAL_BASED_OUTPATIENT_CLINIC_OR_DEPARTMENT_OTHER)
Admission: EM | Admit: 2018-10-17 | Discharge: 2018-10-17 | Disposition: A | Discharge status: Home / Self Care | Payer: 59 | Attending: Emergency Medicine | Admitting: Emergency Medicine

## 2018-10-17 ENCOUNTER — Other Ambulatory Visit: Payer: Self-pay

## 2018-10-17 DIAGNOSIS — R358 Other polyuria: Secondary | ICD-10-CM | POA: Insufficient documentation

## 2018-10-17 DIAGNOSIS — R350 Frequency of micturition: Secondary | ICD-10-CM | POA: Insufficient documentation

## 2018-10-17 DIAGNOSIS — R3589 Other polyuria: Secondary | ICD-10-CM

## 2018-10-17 DIAGNOSIS — R339 Retention of urine, unspecified: Secondary | ICD-10-CM | POA: Diagnosis not present

## 2018-10-17 DIAGNOSIS — Z79899 Other long term (current) drug therapy: Secondary | ICD-10-CM | POA: Insufficient documentation

## 2018-10-17 LAB — PREGNANCY, URINE: Preg Test, Ur: NEGATIVE

## 2018-10-17 LAB — URINALYSIS, ROUTINE W REFLEX MICROSCOPIC
Bilirubin Urine: NEGATIVE
Glucose, UA: NEGATIVE mg/dL
Ketones, ur: NEGATIVE mg/dL
Leukocytes,Ua: NEGATIVE
Nitrite: NEGATIVE
Protein, ur: NEGATIVE mg/dL
Specific Gravity, Urine: >1.03 — ABNORMAL HIGH (ref 1.005–1.030)
pH: 6 (ref 5.0–8.0)

## 2018-10-17 LAB — URINALYSIS, MICROSCOPIC (REFLEX)

## 2018-10-17 LAB — CBG MONITORING, ED: Glucose-Capillary: 93 mg/dL (ref 70–99)

## 2018-10-17 MED ORDER — PHENAZOPYRIDINE HCL 200 MG PO TABS: 200.0000 mg | ORAL_TABLET | Freq: Three times a day (TID) | ORAL | 0 refills | Status: AC | PRN

## 2018-10-17 NOTE — ED Provider Notes (Signed)
[facility] EMERGENCY DEPARTMENT Provider Note   CSN: [id number] Arrival date & time: 10/17/18  0818     History   Chief Complaint Chief Complaint  Patient presents with  . Urinary Frequency    HPI Alison Schultz is a 28 y.o. female.     HPI Patient reports for 2 days she has been having frequent urination.  She denies there is any pain associated with this.  No burning no abdominal pain.  No flank pain no fever no nausea no vomiting.  No vaginal discharge or bleeding.  She denies she has had blurred vision feeling fatigued or generally ill.  She reports only other time she had the symptoms when she had a urinary tract infection once before about 3 years ago. Past Medical History:  Diagnosis Date  . Acute blood loss anemia 03/31/2016  . Medical history non-contributory   . Postpartum care following vaginal delivery (1/10) 03/31/2016  . Postpartum care following vaginal delivery (1/9) 03/31/2016  . Status post vacuum-assisted vaginal delivery 04/01/2016    Patient Active Problem List   Diagnosis Date Noted  . Status post vacuum-assisted vaginal delivery 04/01/2016  . Postpartum care following vaginal delivery (1/9) 03/31/2016  . Acute blood loss anemia 03/31/2016  . Labor and delivery indication for care or intervention 03/30/2016  . Rh negative, maternal 03/30/2016  . Rubella non-immune status, antepartum 03/30/2016  . GBS (group B streptococcus) UTI complicating pregnancy 02/27/2016    Past Surgical History:  Procedure Laterality Date  . NO PAST SURGERIES       OB History    Gravida  1   Para  1   Term  1   Preterm      AB      Living  1     SAB      TAB      Ectopic      Multiple  0   Live Births  1            Home Medications    Prior to Admission medications   Medication Sig Start Date End Date Taking? Authorizing Provider  acetaminophen (TYLENOL) 500 MG tablet Take 1 tablet (500 mg total) by mouth every 6 (six) hours as  needed. 04/30/17   Law, Waylan BogaAlexandra M, PA-C  benzocaine-Menthol (DERMOPLAST) 20-0.5 % AERO Apply 1 application topically as needed for irritation (perineal discomfort). 04/01/16   Neta MendsPaul, Daniela C, CNM  cetirizine-pseudoephedrine (ZYRTEC-D) 5-120 MG tablet Take 1 tablet by mouth 2 (two) times daily. 04/30/17   Law, Waylan BogaAlexandra M, PA-C  coconut oil OIL Apply 1 application topically as needed. 04/01/16   Neta MendsPaul, Daniela C, CNM  dextromethorphan-guaiFENesin (MUCINEX DM) 30-600 MG 12hr tablet Take 1 tablet by mouth 2 (two) times daily as needed for cough. 04/30/17   Emi HolesLaw, Alexandra M, PA-C  ferrous sulfate 325 (65 FE) MG tablet Take 1 tablet (325 mg total) by mouth 2 (two) times daily with a meal. 04/01/16   Neta MendsPaul, Daniela C, CNM  fexofenadine-pseudoephedrine (ALLEGRA-D) 60-120 MG 12 hr tablet Take 1 tablet by mouth every 12 (twelve) hours. 11/08/16   Gilda CreasePollina, Christopher J, MD  ibuprofen (ADVIL,MOTRIN) 400 MG tablet Take 1 tablet (400 mg total) by mouth every 6 (six) hours as needed. 04/30/17   Law, Waylan BogaAlexandra M, PA-C  loperamide (IMODIUM) 2 MG capsule Take 1 capsule (2 mg total) by mouth 4 (four) times daily as needed for diarrhea or loose stools. 04/15/17   Alvira MondaySchlossman, Erin, MD  magnesium oxide (MAG-OX) 400 (  241.3 Mg) MG tablet Take 1 tablet (400 mg total) by mouth daily. 04/01/16   Juliene Pina, CNM  norethindrone-ethinyl estradiol 1/35 (ORTHO-NOVUM, NORTREL,CYCLAFEM) tablet Take 1 tablet by mouth daily.    [provider]  Prenatal Vit-Fe Fumarate-FA (PRENATAL MULTIVITAMIN) TABS tablet Take 1 tablet by mouth daily at 12 noon. 04/01/16   Juliene Pina, CNM    Family History No family history on file.  Social History Social History   Tobacco Use  . Smoking status: Never Smoker  . Smokeless tobacco: Never Used  Substance Use Topics  . Alcohol use: No  . Drug use: No     Allergies   Patient has no known allergies.   Review of Systems Review of Systems 10 Systems reviewed and are negative for  acute change except as noted in the HPI.  Physical Exam Updated Vital Signs BP (!) 128/91 (BP Location: Right Arm)   Pulse 87   Temp 98.5 F (36.9 C) (Oral)   Resp 16   Ht 5\' 1"  (1.549 m)   Wt 77 kg   LMP 10/10/2018   SpO2 100%   BMI 32.06 kg/m   Physical Exam Constitutional:      Appearance: She is well-developed.  HENT:     Head: Normocephalic and atraumatic.  Eyes:     Extraocular Movements: Extraocular movements intact.     Conjunctiva/sclera: Conjunctivae normal.  Neck:     Musculoskeletal: Neck supple.  Cardiovascular:     Rate and Rhythm: Normal rate and regular rhythm.     Heart sounds: Normal heart sounds.  Pulmonary:     Effort: Pulmonary effort is normal.     Breath sounds: Normal breath sounds.  Abdominal:     General: Bowel sounds are normal. There is no distension.     Palpations: Abdomen is soft.     Tenderness: There is no abdominal tenderness.  Musculoskeletal: Normal range of motion.        General: No swelling or tenderness.     Right lower leg: No edema.     Left lower leg: No edema.  Skin:    General: Skin is warm and dry.  Neurological:     General: No focal deficit present.     Mental Status: She is alert and oriented to person, place, and time.     GCS: GCS eye subscore is 4. GCS verbal subscore is 5. GCS motor subscore is 6.     Coordination: Coordination normal.  Psychiatric:        Mood and Affect: Mood normal.      ED Treatments / Results  Labs (all labs ordered are listed, but only abnormal results are displayed) Labs Reviewed  URINALYSIS, ROUTINE W REFLEX MICROSCOPIC  PREGNANCY, URINE    EKG None  Radiology No results found.  Procedures Procedures (including critical care time)  Medications Ordered in ED Medications - No data to display   Initial Impression / Assessment and Plan / ED Course  I have reviewed the triage vital signs and the nursing notes.  Pertinent labs & imaging results that were available  during my care of the patient were reviewed by me and considered in my medical decision making (see chart for details).       Patient is clinically well in appearance.  She is nontoxic and alert.  Vital signs are normal.  Patient's only symptom is frequency of urination.  She denies any medication changes or new over-the-counter supplements or medications.  She  has no fever, no pain.  Urinalysis is negative for UTI.  Will submit for culture due to symptoms.  Patient CBG is 98.  No signs of hyperglycemia.  Will have patient follow-up with her GYN for urinary frequency without other symptoms and return precautions reviewed.  I will give her a trial of Pyridium for several days to see if this improves her symptoms.  Final Clinical Impressions(s) / ED Diagnoses   Final diagnoses:  Frequency of urination and polyuria    ED Discharge Orders    None       Arby BarrettePfeiffer, Dorie Ohms, MD 10/17/18 [phone]

## 2018-10-17 NOTE — ED Triage Notes (Signed)
Urinary frequency x4 days.  Denies dysuria or any vaginal issues.

## 2018-10-17 NOTE — Discharge Instructions (Signed)
1.  At this time your urine is not showing signs of infection.  A urine culture has been done to further check for infection.  Results should be ready in 2 to 3 days. 2.  Follow-up with your gynecologist for recheck for frequent urination without signs of infection. 3.  Return to the emergency department if you develop fever, pain, other worsening or concerning symptoms. 4.  You may take Pyridium to see if this helps with your symptoms.  It will turn your urine bright orange.  If you wear contact lenses they also might stain orange.

## 2018-10-18 LAB — URINE CULTURE: Culture: NO GROWTH

## 2018-10-28 IMAGING — CR DG CHEST 2V
2 series · 2 of 2 positions shown · non-contrast
Comparison: None.

CLINICAL DATA: Progressive cough

EXAM:
CHEST  2 VIEW

[w chest pa]
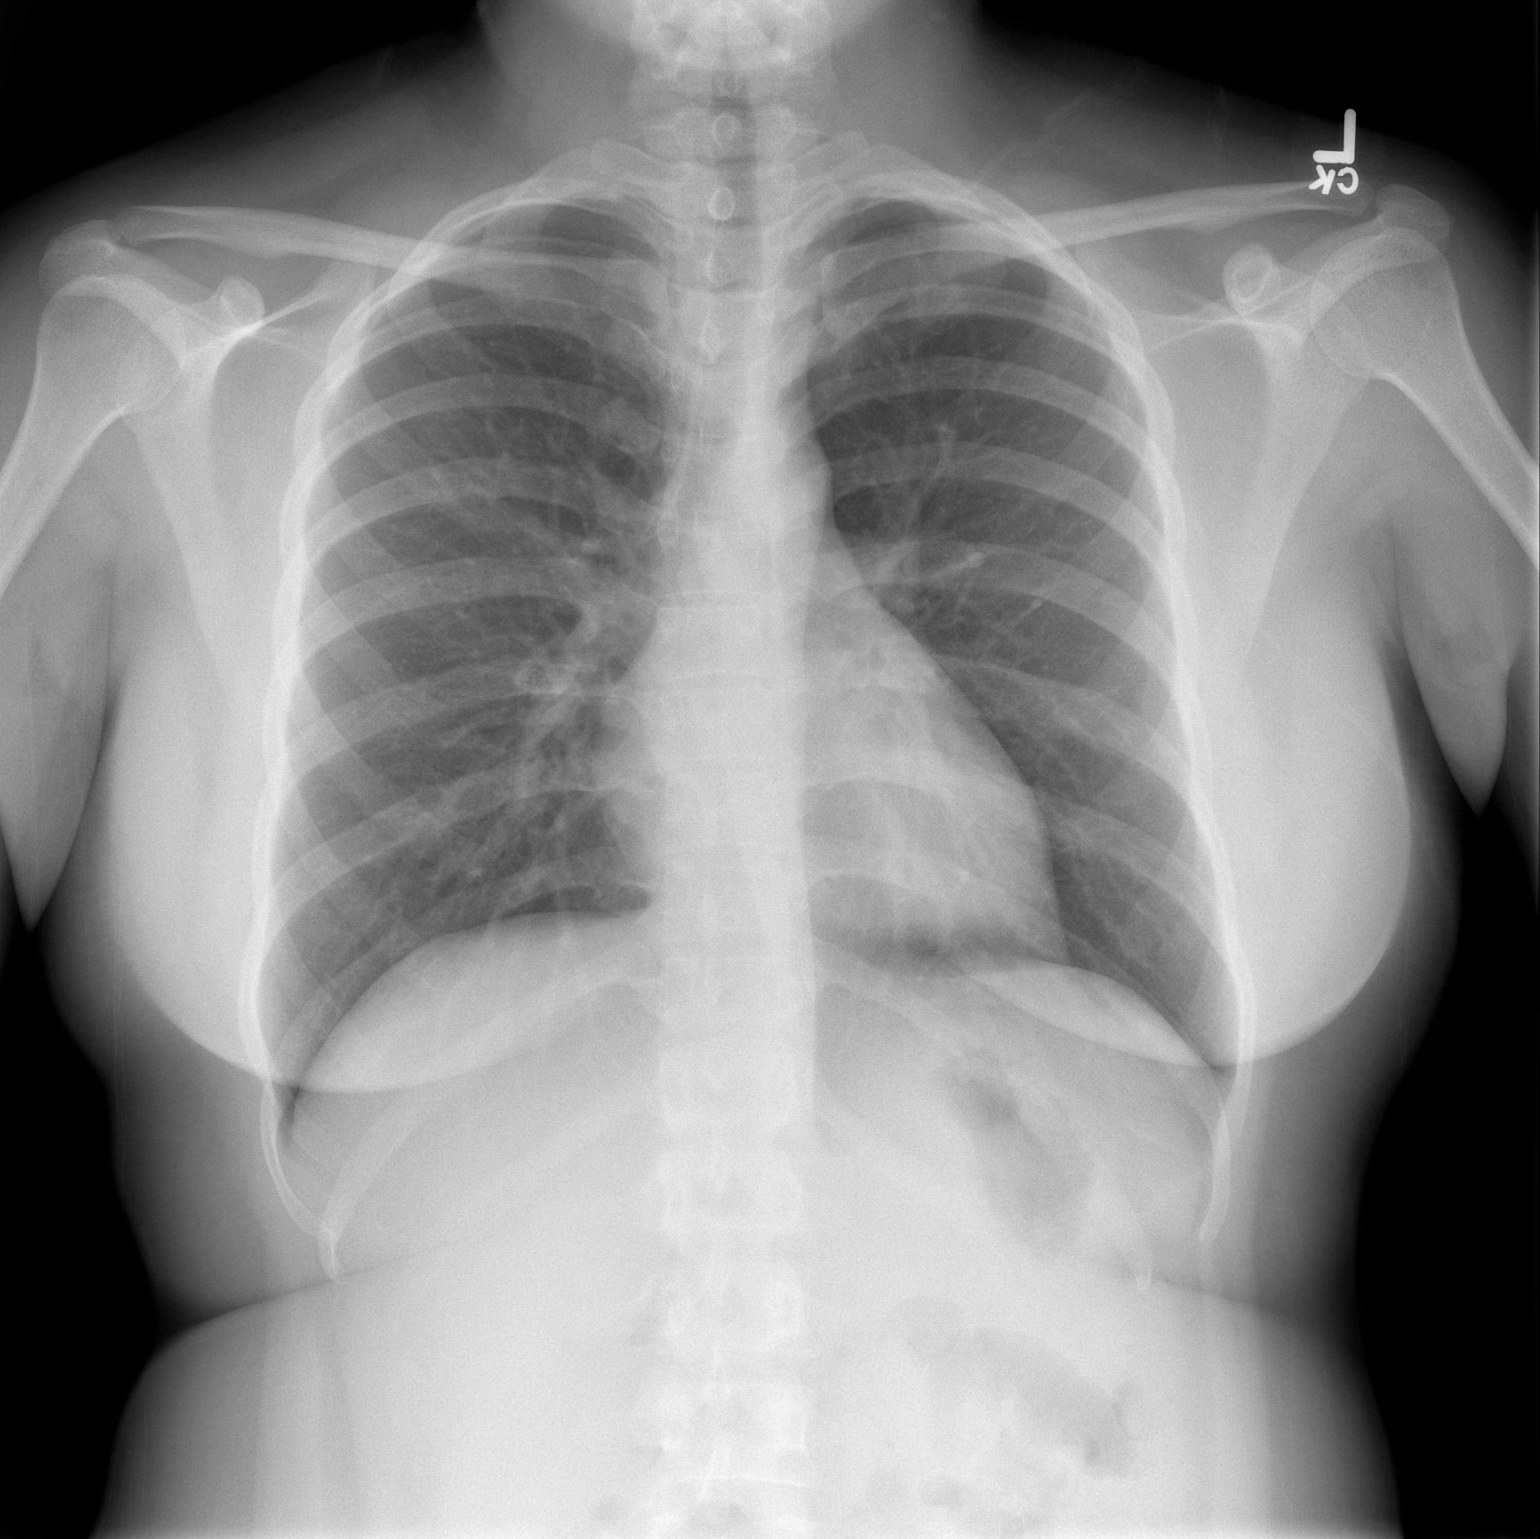

[w chest lat]
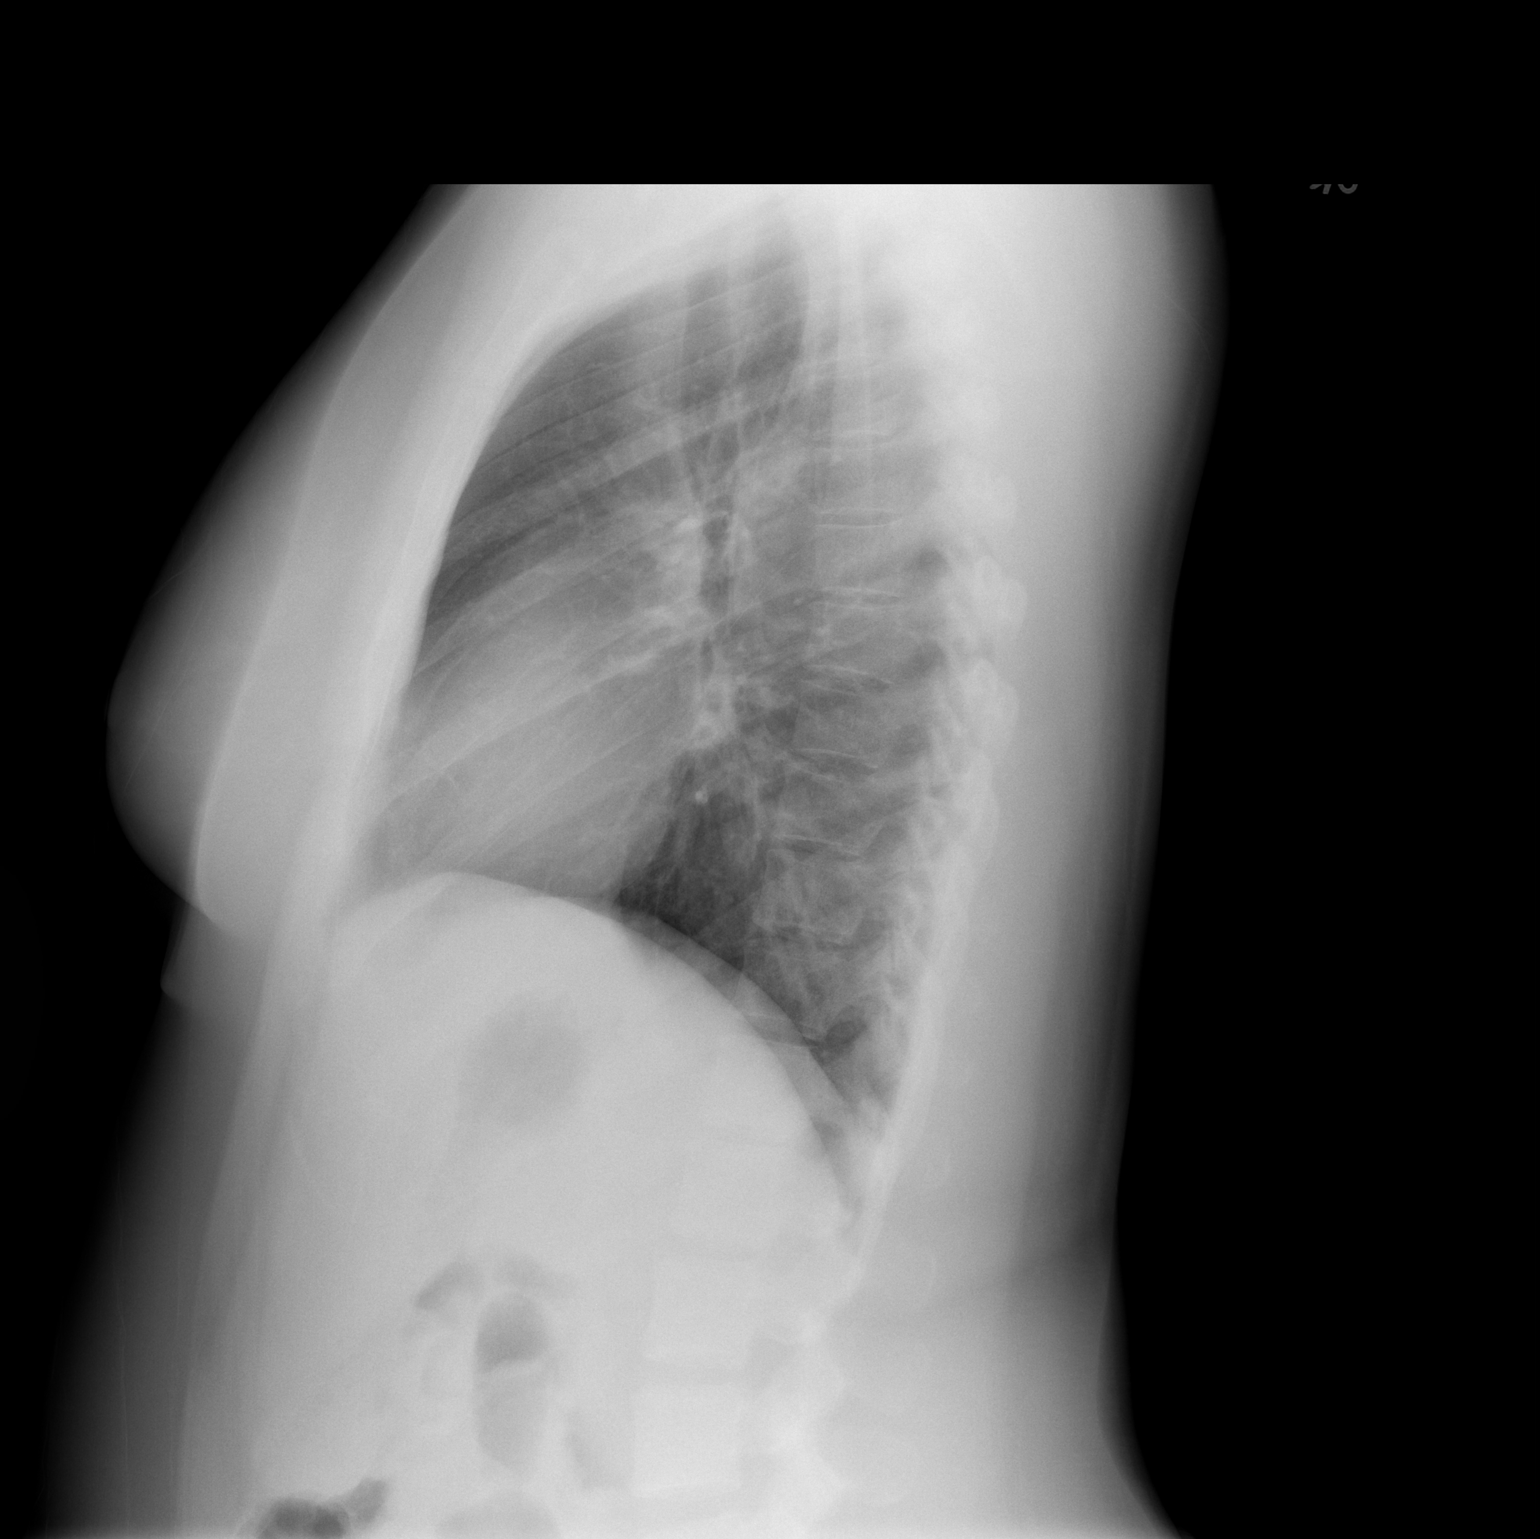

[2 of 2 positions shown; findings below may reference images not displayed]

FINDINGS: Lungs are clear. Heart size and pulmonary vascularity are normal. No
adenopathy. No bone lesions.
IMPRESSION: No edema or consolidation.

## 2018-12-12 DIAGNOSIS — Z01419 Encounter for gynecological examination (general) (routine) without abnormal findings: Secondary | ICD-10-CM | POA: Diagnosis not present

## 2018-12-12 DIAGNOSIS — Z6831 Body mass index (BMI) 31.0-31.9, adult: Secondary | ICD-10-CM | POA: Diagnosis not present

## 2019-01-24 DIAGNOSIS — Z1329 Encounter for screening for other suspected endocrine disorder: Secondary | ICD-10-CM | POA: Diagnosis not present

## 2019-01-24 DIAGNOSIS — Z131 Encounter for screening for diabetes mellitus: Secondary | ICD-10-CM | POA: Diagnosis not present

## 2019-01-24 DIAGNOSIS — Z130 Encounter for screening for diseases of the blood and blood-forming organs and certain disorders involving the immune mechanism: Secondary | ICD-10-CM | POA: Diagnosis not present

## 2019-01-24 DIAGNOSIS — Z0000 Encounter for general adult medical examination without abnormal findings: Secondary | ICD-10-CM | POA: Diagnosis not present

## 2019-01-24 DIAGNOSIS — Z13220 Encounter for screening for lipoid disorders: Secondary | ICD-10-CM | POA: Diagnosis not present

## 2019-05-26 ENCOUNTER — Other Ambulatory Visit: Payer: Self-pay

## 2019-05-26 ENCOUNTER — Emergency Department
Admission: EM | Admit: 2019-05-26 | Discharge: 2019-05-26 | Disposition: A | Discharge status: Home / Self Care | Payer: Self-pay | Attending: Emergency Medicine | Admitting: Emergency Medicine

## 2019-05-26 ENCOUNTER — Emergency Department (HOSPITAL_COMMUNITY): Payer: Self-pay

## 2019-05-26 ENCOUNTER — Encounter (HOSPITAL_COMMUNITY): Payer: Self-pay

## 2019-05-26 DIAGNOSIS — W010XXA Fall on same level from slipping, tripping and stumbling without subsequent striking against object, initial encounter: Secondary | ICD-10-CM | POA: Insufficient documentation

## 2019-05-26 DIAGNOSIS — S93402A Sprain of unspecified ligament of left ankle, initial encounter: Secondary | ICD-10-CM | POA: Insufficient documentation

## 2019-05-26 DIAGNOSIS — S93409A Sprain of unspecified ligament of unspecified ankle, initial encounter: Secondary | ICD-10-CM

## 2019-05-26 MED ORDER — IBUPROFEN 800 MG TABLET
800.00 mg | ORAL_TABLET | ORAL | Status: AC
Start: 2019-05-26 — End: 2019-05-26
  Administered 2019-05-26: 800 mg via ORAL
  Filled 2019-05-26: qty 1

## 2019-05-26 MED ORDER — DICLOFENAC SODIUM 75 MG TABLET,DELAYED RELEASE
75.0000 mg | DELAYED_RELEASE_TABLET | Freq: Two times a day (BID) | ORAL | 0 refills | Status: DC
Start: 2019-05-26 — End: 2024-01-12

## 2019-05-26 NOTE — ED Provider Notes (Signed)
Dr. Fabian November. [facility], [facility]          Emergency Department Visit Note    Date of Service: 05/26/2019  Primary Care Doctor:No Pcp  Patient information was obtained from patient.  History/Exam limitations: none.  Patient presented to the Emergency Department by private vehicle.    Chief Complaint:  Ankle pain    HPI: The patient is a(an) 29 y.o. female.     Patient tripped prior to arrival twisting her left ankle.  Pain to the lateral aspect of the ankle that radiates into the mid ankle.  Unable to bear weight on the ankle.  Worse with movement.  Nothing makes better.  Symptoms are constant.      Review of Systems:    The pertinent positive and negative symptoms are as per HPI. All other systems reviewed and are negative.      Past Medical History:  History reviewed. No pertinent past medical history.    Past Surgical History:  none    Family History:  Family Medical History:     None              No acute family history provided  Social History:  Social History     Tobacco Use   . Smoking status: Not on file   Substance Use Topics   . Alcohol use: Not on file   . Drug use: Not on file     Social History     Substance and Sexual Activity   Drug Use Not on file       Current Outpatient Medications:   Outpatient Medications Marked as Taking for the 05/26/19 encounter [facility] Encounter)   Medication Sig   . diclofenac sodium (VOLTAREN) 75 mg Oral Tablet, Delayed Release (E.C.) Take 1 Tablet (75 mg total) by mouth Twice daily       Allergies:   No Known Allergies    Physical Exam     Vital Signs:  Filed Vitals:    05/26/19 1221   BP: (!) 148/82   Pulse: (!) 112   Resp: 18   Temp: 37 C (98.6 F)   SpO2: 100%       The initial visit vital signs are reviewed as above.    Pulse oximetry is 100% on ra. normal    Physical Exam:   General: No apparent acute distress. Very pleasant.   Eyes: Conjunctiva are clear. Pupils are equal, round  HENT: Mucous membranes are moist. Nares are clear.       Extremities:  Left ankle:  Swelling and tenderness to the lateral aspect, the foot is neurovascularly intact distally  Skin: Warm and dry without rashes  Neurologic: Strength and sensation grossly normal throughout.  Psychiatric: Alert and oriented. Affect within normal limits.      Diagnostics       Radiology:  XR ANKLE LEFT,   Results for orders placed or performed during the hospital encounter of 05/26/19 (from the past 72 hour(s))   XR ANKLE LEFT     Status: None    Narrative    RADIOLOGIST: Theressa Millard, MD    EXAMINATION: XR ANKLE LEFT     EXAM DATE/TIME: 05/26/2019 1:02 PM    TECHNIQUE: THREE  VIEWS WERE OBTAINED.    CLINICAL INDICATION: lateral ankle pain    COMPARISON: None.       FINDINGS:  DISTAL TIBIA/FIBULA: Normal appearance.  ANKLE JOINT: Normal alignment. The joint  spaces are normal  TALUS: Normal appearance.  CALCANEUS: Normal appearance  NAVICULAR: Normal appearance of bones.  SOFT TISSUES: Normal appearance of soft tissues      Impression    1. Normal appearance of ankle joint.   2. No acute radiographic findings.          Radiologist location ID: [id number]       , interpreted by radiologist and independently reviewed by me.    ED Course              Summary/ Plan:   Orders Placed This Encounter   . XR ANKLE LEFT   . ibuprofen (MOTRIN) tablet   . diclofenac sodium (VOLTAREN) 75 mg Oral Tablet, Delayed Release (E.C.)       Placed in stirrup splint by ed tech checked by me remains nv intact distally      Encounter Diagnosis   Name Primary?   Marland Kitchen Ankle sprain Yes       Disposistion  1) Discharge home : Stable  2) Return to ED with worsening conditions or any new concerns  3) Strict return precautions given to patient  4) Patient understood and agrees with the plan    Filed Vitals:    05/26/19 1221   BP: (!) 148/82   Pulse: (!) 112   Resp: 18   Temp: 37 C (98.6 F)   SpO2: 100%         Prescriptions:  Current Discharge Medication List      START taking these medications    Details   diclofenac sodium  (VOLTAREN) 75 mg Oral Tablet, Delayed Release (E.C.) Take 1 Tablet (75 mg total) by mouth Twice daily  Qty: 14 Tablet, Refills: 0             Follow up:  Pattricia Boss, DO  [address]  [place] [address]  [phone]    Go in 1 week  As needed, If symptoms worsen      Fatima Blank, MD 05/26/2019, 13:16         Portions of this note may be dictated using voice recognition software or a dictation service. Variances in spelling and vocabulary are possible and unintentional. Not all errors are caught/corrected. Please notify the Thereasa Parkin if any discrepancies are noted or if the meaning of any statement is not clear.

## 2019-05-26 NOTE — ED Triage Notes (Signed)
Pt presents to the ED with L foot/ankle pain after falling PTA. Pt unable to stand on the leg d/t pain.

## 2019-05-26 NOTE — ED Nurses Note (Signed)
Ankle air splint applied.  Ice bag given for home use.      Discharge instructions reviewed with patient, no questions asked by patient. Patient states understanding of instructions.      Current Discharge Medication List      START taking these medications.      Details   diclofenac sodium 75 mg Tablet, Delayed Release (E.C.)  Commonly known as: VOLTAREN   75 mg, Oral, 2 TIMES DAILY  Qty: 14 Tablet  Refills: 0

## 2019-12-04 ENCOUNTER — Ambulatory Visit (INDEPENDENT_AMBULATORY_CARE_PROVIDER_SITE_OTHER): Payer: Self-pay | Admitting: Student in an Organized Health Care Education/Training Program

## 2019-12-28 ENCOUNTER — Ambulatory Visit (INDEPENDENT_AMBULATORY_CARE_PROVIDER_SITE_OTHER): Payer: Self-pay | Admitting: Student in an Organized Health Care Education/Training Program

## 2024-01-12 ENCOUNTER — Encounter (INDEPENDENT_AMBULATORY_CARE_PROVIDER_SITE_OTHER): Payer: Self-pay

## 2024-01-12 ENCOUNTER — Other Ambulatory Visit: Payer: Self-pay

## 2024-01-12 ENCOUNTER — Ambulatory Visit (INDEPENDENT_AMBULATORY_CARE_PROVIDER_SITE_OTHER): Admitting: Student in an Organized Health Care Education/Training Program

## 2024-01-12 VITALS — BP 118/70 | HR 80 | Temp 98.8°F | Wt 184.0 lb

## 2024-01-12 DIAGNOSIS — N6002 Solitary cyst of left breast: Secondary | ICD-10-CM

## 2024-01-12 NOTE — Progress Notes (Signed)
 [address] [place]  [address]  [place] [address]    Acute Visit     Name: Susan Wallace MRN:  [id number]   Date: 01/12/2024 Age: 33 y.o.         Chief Complaint(s):   Chief Complaint   Patient presents with    Bump     Under left breast       SUBJECTIVE:  Susan Wallace is a 33 y.o. female presenting today with bump under left breast that she just noticed today; is unsure how long it has been there; denies pain, itching, etc. To area.      Review of Systems   Skin:  Negative for itching and rash.        +cystic lesion       Past Medical History:  There are no active problems to display for this patient.      Medications:  Current Outpatient Medications   Medication Sig    AVIANE 0.1-20 mg-mcg Oral Tablet Take 1 Tablet by mouth Daily        Allergies:  Allergies[1]     Social History:  Social History[2]     Family History:  Family Medical History:    None           OBJECTIVE:  BP 118/70   Pulse 80   Temp 37.1 C (98.8 F) (Tympanic)   Wt 83.5 kg (184 lb)   SpO2 100%   BMI 34.77 kg/m         Physical Exam  Constitutional:       General: She is not in acute distress.  HENT:      Head: Normocephalic and atraumatic.   Eyes:      Conjunctiva/sclera: Conjunctivae normal.   Pulmonary:      Effort: Pulmonary effort is normal. No respiratory distress.   Chest:          Comments: Very small superficial cyst-like lesion to bottom crease of left breast. No overlying erythema, warmth; non-tender to palpation. Left breast otherwise appears unremarkable.  Neurological:      General: No focal deficit present.      Mental Status: She is alert.   Psychiatric:         Mood and Affect: Mood and affect normal.         Cognition and Memory: Memory normal.         Judgment: Judgment normal.         ASSESSMENT and PLAN:    ICD-10-CM    1. Cyst (solitary) of breast, left  N60.02         Advised warm compresses to area and return if worsening to include erythema, warmth, or pain.    Asberry Hummer, D.O. 01/12/2024,  11:35*    *Portions of this note may have been completed using a dictation device. Due to this, there may be spelling or grammatical errors noted. Should any questions arise, please notify the author.         [1] No Known Allergies  [2]   Social History  Tobacco Use    Smoking status: Never    Smokeless tobacco: Never

## 2024-01-12 NOTE — Nursing Note (Signed)
 BP 118/70   Pulse 80   Temp 37.1 C (98.8 F) (Tympanic)   Wt 83.5 kg (184 lb)   SpO2 100%   BMI 34.77 kg/m        Rosabel Pepper, RN 01/12/2024, 11:30
